# Patient Record
Sex: Female | Born: 1985 | Race: White | Hispanic: No | Marital: Married | Smoking: Never smoker
Health system: Southern US, Community
[De-identification: ages and names within clinical notes are randomized; demographics above are authoritative.]

## PROBLEM LIST (undated history)

## (undated) DIAGNOSIS — R519 Headache, unspecified: Secondary | ICD-10-CM

## (undated) DIAGNOSIS — F329 Major depressive disorder, single episode, unspecified: Secondary | ICD-10-CM

## (undated) DIAGNOSIS — T783XXA Angioneurotic edema, initial encounter: Secondary | ICD-10-CM

## (undated) DIAGNOSIS — F32A Depression, unspecified: Secondary | ICD-10-CM

## (undated) DIAGNOSIS — J45909 Unspecified asthma, uncomplicated: Secondary | ICD-10-CM

## (undated) DIAGNOSIS — F419 Anxiety disorder, unspecified: Secondary | ICD-10-CM

## (undated) DIAGNOSIS — L509 Urticaria, unspecified: Secondary | ICD-10-CM

## (undated) DIAGNOSIS — Z9889 Other specified postprocedural states: Secondary | ICD-10-CM

## (undated) DIAGNOSIS — R51 Headache: Secondary | ICD-10-CM

## (undated) DIAGNOSIS — M797 Fibromyalgia: Secondary | ICD-10-CM

## (undated) DIAGNOSIS — L309 Dermatitis, unspecified: Secondary | ICD-10-CM

## (undated) DIAGNOSIS — R112 Nausea with vomiting, unspecified: Secondary | ICD-10-CM

## (undated) DIAGNOSIS — E039 Hypothyroidism, unspecified: Secondary | ICD-10-CM

## (undated) HISTORY — PX: WISDOM TOOTH EXTRACTION: SHX21

## (undated) HISTORY — DX: Urticaria, unspecified: L50.9

## (undated) HISTORY — DX: Angioneurotic edema, initial encounter: T78.3XXA

## (undated) HISTORY — PX: NASAL SINUS SURGERY: SHX719

## (undated) HISTORY — DX: Dermatitis, unspecified: L30.9

## (undated) HISTORY — PX: KNEE ARTHROSCOPY: SUR90

## (undated) HISTORY — PX: ENDOMETRIAL BIOPSY: SHX622

---

## 2017-07-25 ENCOUNTER — Emergency Department: Payer: Managed Care, Other (non HMO)

## 2017-07-25 ENCOUNTER — Emergency Department
Admission: EM | Admit: 2017-07-25 | Discharge: 2017-07-26 | Disposition: A | Discharge status: Home / Self Care | Payer: Managed Care, Other (non HMO) | Attending: Emergency Medicine | Admitting: Emergency Medicine

## 2017-07-25 ENCOUNTER — Other Ambulatory Visit: Payer: Self-pay

## 2017-07-25 ENCOUNTER — Encounter: Payer: Self-pay | Admitting: Radiology

## 2017-07-25 DIAGNOSIS — R102 Pelvic and perineal pain: Secondary | ICD-10-CM | POA: Insufficient documentation

## 2017-07-25 DIAGNOSIS — R1031 Right lower quadrant pain: Secondary | ICD-10-CM | POA: Diagnosis present

## 2017-07-25 LAB — COMPREHENSIVE METABOLIC PANEL
ALBUMIN: 4.1 g/dL (ref 3.5–5.0)
ALT: 20 U/L (ref 0–44)
AST: 20 U/L (ref 15–41)
Alkaline Phosphatase: 63 U/L (ref 38–126)
Anion gap: 7 (ref 5–15)
BILIRUBIN TOTAL: 0.7 mg/dL (ref 0.3–1.2)
BUN: 12 mg/dL (ref 6–20)
CO2: 23 mmol/L (ref 22–32)
Calcium: 8.8 mg/dL — ABNORMAL LOW (ref 8.9–10.3)
Chloride: 108 mmol/L (ref 98–111)
Creatinine, Ser: 0.8 mg/dL (ref 0.44–1.00)
GFR calc Af Amer: >60 mL/min (ref >60)
GFR calc non Af Amer: >60 mL/min (ref >60)
GLUCOSE: 101 mg/dL — AB (ref 70–99)
Potassium: 3.6 mmol/L (ref 3.5–5.1)
Sodium: 138 mmol/L (ref 135–145)
TOTAL PROTEIN: 7.5 g/dL (ref 6.5–8.1)

## 2017-07-25 LAB — URINALYSIS, COMPLETE (UACMP) WITH MICROSCOPIC
BILIRUBIN URINE: NEGATIVE
Glucose, UA: NEGATIVE mg/dL
Hgb urine dipstick: NEGATIVE
Ketones, ur: NEGATIVE mg/dL
LEUKOCYTES UA: NEGATIVE
Nitrite: NEGATIVE
PROTEIN: NEGATIVE mg/dL
Specific Gravity, Urine: 1.01 (ref 1.005–1.030)
pH: 6 (ref 5.0–8.0)

## 2017-07-25 LAB — CBC
HEMATOCRIT: 40.7 % (ref 35.0–47.0)
HEMOGLOBIN: 14.4 g/dL (ref 12.0–16.0)
MCH: 31.1 pg (ref 26.0–34.0)
MCHC: 35.3 g/dL (ref 32.0–36.0)
MCV: 88.1 fL (ref 80.0–100.0)
Platelets: 302 10*3/uL (ref 150–440)
RBC: 4.62 MIL/uL (ref 3.80–5.20)
RDW: 13.1 % (ref 11.5–14.5)
WBC: 8.7 10*3/uL (ref 3.6–11.0)

## 2017-07-25 LAB — POCT PREGNANCY, URINE: Preg Test, Ur: NEGATIVE

## 2017-07-25 LAB — WET PREP, GENITAL
Clue Cells Wet Prep HPF POC: NONE SEEN
Sperm: NONE SEEN
TRICH WET PREP: NONE SEEN
YEAST WET PREP: NONE SEEN

## 2017-07-25 LAB — PREGNANCY, URINE: PREG TEST UR: NEGATIVE

## 2017-07-25 LAB — LIPASE, BLOOD: Lipase: 36 U/L (ref 11–51)

## 2017-07-25 MED ORDER — ONDANSETRON HCL 4 MG/2ML IJ SOLN
4.0000 mg | Freq: Once | INTRAMUSCULAR | Status: AC
  Administered 2017-07-25: 4 mg via INTRAVENOUS
  Filled 2017-07-25: qty 2

## 2017-07-25 MED ORDER — SODIUM CHLORIDE 0.9 % IV SOLN
Freq: Once | INTRAVENOUS | Status: AC
  Administered 2017-07-25: 22:00:00 via INTRAVENOUS

## 2017-07-25 MED ORDER — IOHEXOL 300 MG/ML  SOLN
100.0000 mL | Freq: Once | INTRAMUSCULAR | Status: AC | PRN
  Administered 2017-07-25: 100 mL via INTRAVENOUS

## 2017-07-25 MED ORDER — MORPHINE SULFATE (PF) 4 MG/ML IV SOLN
4.0000 mg | Freq: Once | INTRAVENOUS | Status: AC
  Administered 2017-07-25: 4 mg via INTRAVENOUS
  Filled 2017-07-25: qty 1

## 2017-07-25 NOTE — ED Provider Notes (Signed)
[facility] Emergency Department Provider Note       Time seen: ----------------------------------------- 9:30 PM on 07/25/2017 -----------------------------------------   I have reviewed the triage vital signs and the nursing notes.  HISTORY   Chief Complaint Abdominal Pain    HPI Beth Edwards is a 32 y.o. female with no significant past medical history who presents to the ED for right lower quadrant abdominal pain since 4:30 PM.  Patient reports some nausea.  Patient states this is different pain than when she had an ovarian cyst last year.  She does not think she could be pregnant because she has an IUD.  She denies fevers, chills or other complaints.  No past medical history on file.  There are no active problems to display for this patient.   Allergies Ambien [zolpidem tartrate]  Social History Social History   Tobacco Use  . Smoking status: Not on file  Substance Use Topics  . Alcohol use: Not on file  . Drug use: Not on file   Review of Systems Constitutional: Negative for fever. Cardiovascular: Negative for chest pain. Respiratory: Negative for shortness of breath. Gastrointestinal: Positive for abdominal pain, nausea Musculoskeletal: Negative for back pain. Skin: Negative for rash. Neurological: Negative for headaches, focal weakness or numbness.  All systems negative/normal/unremarkable except as stated in the HPI  ____________________________________________   PHYSICAL EXAM:  VITAL SIGNS: ED Triage Vitals  Enc Vitals Group     BP 07/25/17 1928 137/70     Pulse Rate 07/25/17 1928 79     Resp 07/25/17 1928 18     Temp 07/25/17 1928 98.7 F (37.1 C)     Temp Source 07/25/17 1928 Oral     SpO2 07/25/17 1928 100 %     Weight 07/25/17 1929 180 lb (81.6 kg)     Height 07/25/17 1929 5\' 7"  (1.702 m)     Head Circumference --      Peak Flow --      Pain Score 07/25/17 1929 9     Pain Loc --      Pain Edu? --    Excl. in GC? --    Constitutional: Alert and oriented. Well appearing and in no distress. Eyes: Conjunctivae are normal. Normal extraocular movements. ENT   Head: Normocephalic and atraumatic.   Nose: No congestion/rhinnorhea.   Mouth/Throat: Mucous membranes are moist.   Neck: No stridor. Cardiovascular: Normal rate, regular rhythm. No murmurs, rubs, or gallops. Respiratory: Normal respiratory effort without tachypnea nor retractions. Breath sounds are clear and equal bilaterally. No wheezes/rales/rhonchi. Gastrointestinal: Specific pain at McBurney's point, no rebound or guarding.  Normal bowel sounds. Genitourinary: Light vaginal discharge, no significant adnexal tenderness, no cervical motion tenderness Musculoskeletal: Nontender with normal range of motion in extremities. No lower extremity tenderness nor edema. Neurologic:  Normal speech and language. No gross focal neurologic deficits are appreciated.  Skin:  Skin is warm, dry and intact. No rash noted. Psychiatric: Mood and affect are normal. Speech and behavior are normal.  ____________________________________________  ED COURSE:  As part of my medical decision making, I reviewed the following data within the electronic MEDICAL RECORD NUMBER History obtained from family if available, nursing notes, old chart and ekg, as well as notes from prior ED visits. Patient presented for right lower quadrant abdominal pain, we will assess with labs and imaging as indicated at this time. Clinical Course as of Jul 25 2248  Fri Jul 25, 2017  2238 Patient has essentially had chronic pelvic  pain since having an ovarian cyst last year, reports this pain is worse and it is more specifically right-sided than prior.   [JW]    Clinical Course User Index [JW] Emily FilbertWilliams, Jonathan E, MD   Procedures ____________________________________________   LABS (pertinent positives/negatives)  Labs Reviewed  COMPREHENSIVE METABOLIC PANEL - Abnormal;  Notable for the following components:      Result Value   Glucose, Bld 101 (*)    Calcium 8.8 (*)    All other components within normal limits  URINALYSIS, COMPLETE (UACMP) WITH MICROSCOPIC - Abnormal; Notable for the following components:   Color, Urine YELLOW (*)    APPearance CLEAR (*)    Bacteria, UA RARE (*)    All other components within normal limits  CHLAMYDIA/NGC RT PCR ([facility] ONLY)  WET PREP, GENITAL  LIPASE, BLOOD  CBC  PREGNANCY, URINE  POCT PREGNANCY, URINE    RADIOLOGY  CT the abdomen pelvis with contrast IMPRESSION: No acute process demonstrated in the abdomen or pelvis. No evidence of bowel obstruction or inflammation. Appendix is normal. Pelvic ultrasound Is pending ____________________________________________  DIFFERENTIAL DIAGNOSIS   Appendicitis, renal colic, ovarian cyst, constipation, pregnancy  FINAL ASSESSMENT AND PLAN  Right lower quadrant abdominal pain   Plan: The patient had presented for abdominal pain. Patient's labs were unremarkable. Patient's imaging was negative regarding CT the abdomen pelvis.  Ultrasound is still pending at this time.   Ulice DashJohnathan E Williams, MD   Note: This note was generated in part or whole with voice recognition software. Voice recognition is usually quite accurate but there are transcription errors that can and very often do occur. I apologize for any typographical errors that were not detected and corrected.     Emily FilbertWilliams, Jonathan E, MD 07/25/17 2251

## 2017-07-25 NOTE — ED Notes (Signed)
Pt in CT at this time.

## 2017-07-25 NOTE — ED Triage Notes (Signed)
Reports right lower abdominal pain since 4:30 pm.  Reports some nausea.

## 2017-07-26 LAB — CHLAMYDIA/NGC RT PCR (ARMC ONLY)
Chlamydia Tr: NOT DETECTED
N gonorrhoeae: NOT DETECTED

## 2017-07-26 MED ORDER — HALOPERIDOL LACTATE 5 MG/ML IJ SOLN
2.5000 mg | Freq: Once | INTRAMUSCULAR | Status: AC
  Administered 2017-07-26: 2.5 mg via INTRAVENOUS
  Filled 2017-07-26: qty 1

## 2017-07-26 MED ORDER — KETOROLAC TROMETHAMINE 30 MG/ML IJ SOLN
15.0000 mg | Freq: Once | INTRAMUSCULAR | Status: AC
  Administered 2017-07-26: 15 mg via INTRAVENOUS
  Filled 2017-07-26: qty 1

## 2017-07-26 MED ORDER — MELOXICAM 15 MG PO TABS: 15.0000 mg | ORAL_TABLET | Freq: Every day | ORAL | 0 refills | Status: AC

## 2017-07-26 MED ORDER — DICYCLOMINE HCL 20 MG PO TABS: 20.0000 mg | ORAL_TABLET | Freq: Three times a day (TID) | ORAL | 0 refills | Status: DC | PRN

## 2017-07-26 NOTE — ED Provider Notes (Signed)
Care signed over from Dr. Mayford KnifeWilliams.  Pelvic ultrasound reassuring.  The patient persists with discomfort.  Had a lengthy discussion with the patient regarding the diagnostic uncertainty but I [facility].  Given Toradol and haloperidol here with improvement in her symptoms.  And she is medically stable for outpatient management.  She verbalizes understanding and agreement the plan.   Beth Edwards, Beth Morandi, MD 07/26/17 [phone]

## 2017-07-26 NOTE — Discharge Instructions (Signed)
Fortunately today your blood work, your CT scan, and your ultrasound were reassuring.  Please make sure you make an appointment with the [facility] gynecologist for reevaluation and return to the emergency department for any concerns such as fevers, chills, worsening pain, if you cannot eat or drink, or for any other issues whatsoever.  It was a pleasure to take care of you today, and thank you for coming to our emergency department.  If you have any questions or concerns before leaving please ask the nurse to grab me and I'm more than happy to go through your aftercare instructions again.  If you were prescribed any opioid pain medication today such as Norco, Vicodin, Percocet, morphine, hydrocodone, or oxycodone please make sure you do not drive when you are taking this medication as it can alter your ability to drive safely.  If you have any concerns once you are home that you are not improving or are in fact getting worse before you can make it to your follow-up appointment, please do not hesitate to call 911 and come back for further evaluation.  Merrily Brittle, MD  Results for orders placed or performed during the hospital encounter of 07/25/17  Chlamydia/NGC rt PCR ([facility] only)  Result Value Ref Range   Specimen source GC/Chlam ENDOCERVICAL    Chlamydia Tr NOT DETECTED NOT DETECTED   N gonorrhoeae NOT DETECTED NOT DETECTED  Wet prep, genital  Result Value Ref Range   Yeast Wet Prep HPF POC NONE SEEN NONE SEEN   Trich, Wet Prep NONE SEEN NONE SEEN   Clue Cells Wet Prep HPF POC NONE SEEN NONE SEEN   WBC, Wet Prep HPF POC FEW (A) NONE SEEN   Sperm NONE SEEN   Lipase, blood  Result Value Ref Range   Lipase 36 11 - 51 U/L  Comprehensive metabolic panel  Result Value Ref Range   Sodium 138 135 - 145 mmol/L   Potassium 3.6 3.5 - 5.1 mmol/L   Chloride 108 98 - 111 mmol/L   CO2 23 22 - 32 mmol/L   Glucose, Bld 101 (H) 70 - 99 mg/dL   BUN 12 6 - 20 mg/dL   Creatinine, Ser 4.09 0.44 - 1.00 mg/dL    Calcium 8.8 (L) 8.9 - 10.3 mg/dL   Total Protein 7.5 6.5 - 8.1 g/dL   Albumin 4.1 3.5 - 5.0 g/dL   AST 20 15 - 41 U/L   ALT 20 0 - 44 U/L   Alkaline Phosphatase 63 38 - 126 U/L   Total Bilirubin 0.7 0.3 - 1.2 mg/dL   GFR calc non Af Amer >60 >60 mL/min   GFR calc Af Amer >60 >60 mL/min   Anion gap 7 5 - 15  CBC  Result Value Ref Range   WBC 8.7 3.6 - 11.0 K/uL   RBC 4.62 3.80 - 5.20 MIL/uL   Hemoglobin 14.4 12.0 - 16.0 g/dL   HCT 81.1 91.4 - 78.2 %   MCV 88.1 80.0 - 100.0 fL   MCH 31.1 26.0 - 34.0 pg   MCHC 35.3 32.0 - 36.0 g/dL   RDW 95.6 21.3 - 08.6 %   Platelets 302 150 - 440 K/uL  Urinalysis, Complete w Microscopic  Result Value Ref Range   Color, Urine YELLOW (A) YELLOW   APPearance CLEAR (A) CLEAR   Specific Gravity, Urine 1.010 1.005 - 1.030   pH 6.0 5.0 - 8.0   Glucose, UA NEGATIVE NEGATIVE mg/dL   Hgb urine dipstick NEGATIVE NEGATIVE  Bilirubin Urine NEGATIVE NEGATIVE   Ketones, ur NEGATIVE NEGATIVE mg/dL   Protein, ur NEGATIVE NEGATIVE mg/dL   Nitrite NEGATIVE NEGATIVE   Leukocytes, UA NEGATIVE NEGATIVE   WBC, UA 0-5 0 - 5 WBC/hpf   Bacteria, UA RARE (A) NONE SEEN   Squamous Epithelial / LPF 0-5 0 - 5  Pregnancy, urine  Result Value Ref Range   Preg Test, Ur NEGATIVE NEGATIVE  Pregnancy, urine POC  Result Value Ref Range   Preg Test, Ur NEGATIVE NEGATIVE   US Pelvis Transvanginal Non-ob (tv Only)  Result Date: 07/26/2017 CLINICAL DATA:  32 y/o  F; pelvic pain since 4:30 p.m. EXAM: TRANSABDOMINAL AND TRANSVAGINAL ULTRASOUND OF PELVIS DOPPLER ULTRASOUND OF OVARIES TECHNIQUE: Both transabdominal and transvaginal ultrasound examinations of the pelvis were performed. Transabdominal technique was performed for global imaging of the pelvis including uterus, ovaries, adnexal regions, and pelvic cul-de-sac. It was necessary to proceed with endovaginal exam following the transabdominal exam to visualize the uterus and adnexa. Color and duplex Doppler ultrasound was  utilized to evaluate blood flow to the ovaries. COMPARISON:  07/25/2017 CT abdomen and pelvis FINDINGS: Uterus Measurements: 7.6 x 2.9 x 4.6 cm. No fibroids or other mass visualized. Endometrium Thickness: 4 mm. No focal abnormality visualized. IUD well seated within the uterine fundus. Right ovary Measurements: 3.1 x 2.1 x 2.0 cm. Normal appearance/no adnexal mass. Left ovary Measurements: 2.2 x 1.5 x 1.9 cm. Normal appearance/no adnexal mass. Pulsed Doppler evaluation of both ovaries demonstrates normal low-resistance arterial and venous waveforms. Other findings No abnormal free fluid. IMPRESSION: Normal pelvic ultrasound. IUD well seated within the uterine fundus. Electronically Signed   By: Mitzi Hansen M.D.   On: 07/26/2017 00:02   US Pelvis Complete  Result Date: 07/26/2017 CLINICAL DATA:  32 y/o  F; pelvic pain since 4:30 p.m. EXAM: TRANSABDOMINAL AND TRANSVAGINAL ULTRASOUND OF PELVIS DOPPLER ULTRASOUND OF OVARIES TECHNIQUE: Both transabdominal and transvaginal ultrasound examinations of the pelvis were performed. Transabdominal technique was performed for global imaging of the pelvis including uterus, ovaries, adnexal regions, and pelvic cul-de-sac. It was necessary to proceed with endovaginal exam following the transabdominal exam to visualize the uterus and adnexa. Color and duplex Doppler ultrasound was utilized to evaluate blood flow to the ovaries. COMPARISON:  07/25/2017 CT abdomen and pelvis FINDINGS: Uterus Measurements: 7.6 x 2.9 x 4.6 cm. No fibroids or other mass visualized. Endometrium Thickness: 4 mm. No focal abnormality visualized. IUD well seated within the uterine fundus. Right ovary Measurements: 3.1 x 2.1 x 2.0 cm. Normal appearance/no adnexal mass. Left ovary Measurements: 2.2 x 1.5 x 1.9 cm. Normal appearance/no adnexal mass. Pulsed Doppler evaluation of both ovaries demonstrates normal low-resistance arterial and venous waveforms. Other findings No abnormal free fluid.  IMPRESSION: Normal pelvic ultrasound. IUD well seated within the uterine fundus. Electronically Signed   By: Mitzi Hansen M.D.   On: 07/26/2017 00:02   Ct Abdomen Pelvis W Contrast  Result Date: 07/25/2017 CLINICAL DATA:  Right lower abdominal pain and nausea since 4:30 p.m. EXAM: CT ABDOMEN AND PELVIS WITH CONTRAST TECHNIQUE: Multidetector CT imaging of the abdomen and pelvis was performed using the standard protocol following bolus administration of intravenous contrast. CONTRAST:  OMNIPAQUE IOHEXOL 300 MG/ML  SOLN COMPARISON:  None. FINDINGS: Lower chest: The lung bases are clear. Hepatobiliary: No focal liver abnormality is seen. No gallstones, gallbladder wall thickening, or biliary dilatation. Pancreas: Unremarkable. No pancreatic ductal dilatation or surrounding inflammatory changes. Spleen: Normal in size without focal abnormality. Adrenals/Urinary Tract: Adrenal  glands are unremarkable. Kidneys are normal, without renal calculi, focal lesion, or hydronephrosis. Bladder is unremarkable. Stomach/Bowel: Stomach, small bowel, and colon are not abnormally distended. No wall thickening or inflammatory changes are appreciated. Stool throughout the colon. Appendix is normal. Vascular/Lymphatic: No significant vascular findings are present. No enlarged abdominal or pelvic lymph nodes. Reproductive: Uterus and ovaries are not enlarged. An intrauterine device is in place. Position appears appropriate. Other: No free air or free fluid in the abdomen. Abdominal wall musculature appears intact. Musculoskeletal: No acute or significant osseous findings. IMPRESSION: No acute process demonstrated in the abdomen or pelvis. No evidence of bowel obstruction or inflammation. Appendix is normal. Electronically Signed   By: Burman NievesWilliam  Stevens M.D.   On: 07/25/2017 22:20   [place] Pelvic Doppler (torsion R/o Or Mass Arterial Flow)  Result Date: 07/26/2017 CLINICAL DATA:  32 y/o  F; pelvic pain since 4:30 p.m.  EXAM: TRANSABDOMINAL AND TRANSVAGINAL ULTRASOUND OF PELVIS DOPPLER ULTRASOUND OF OVARIES TECHNIQUE: Both transabdominal and transvaginal ultrasound examinations of the pelvis were performed. Transabdominal technique was performed for global imaging of the pelvis including uterus, ovaries, adnexal regions, and pelvic cul-de-sac. It was necessary to proceed with endovaginal exam following the transabdominal exam to visualize the uterus and adnexa. Color and duplex Doppler ultrasound was utilized to evaluate blood flow to the ovaries. COMPARISON:  07/25/2017 CT abdomen and pelvis FINDINGS: Uterus Measurements: 7.6 x 2.9 x 4.6 cm. No fibroids or other mass visualized. Endometrium Thickness: 4 mm. No focal abnormality visualized. IUD well seated within the uterine fundus. Right ovary Measurements: 3.1 x 2.1 x 2.0 cm. Normal appearance/no adnexal mass. Left ovary Measurements: 2.2 x 1.5 x 1.9 cm. Normal appearance/no adnexal mass. Pulsed Doppler evaluation of both ovaries demonstrates normal low-resistance arterial and venous waveforms. Other findings No abnormal free fluid. IMPRESSION: Normal pelvic ultrasound. IUD well seated within the uterine fundus. Electronically Signed   By: Mitzi HansenLance  Furusawa-Stratton M.D.   On: 07/26/2017 00:02

## 2017-09-05 ENCOUNTER — Other Ambulatory Visit: Payer: Managed Care, Other (non HMO)

## 2017-09-09 ENCOUNTER — Other Ambulatory Visit: Payer: Self-pay

## 2017-09-09 ENCOUNTER — Encounter
Admission: RE | Admit: 2017-09-09 | Discharge: 2017-09-09 | Disposition: A | Discharge status: Home / Self Care | Payer: 59 | Source: Ambulatory Visit | Attending: Obstetrics & Gynecology | Admitting: Obstetrics & Gynecology

## 2017-09-09 DIAGNOSIS — G8929 Other chronic pain: Secondary | ICD-10-CM | POA: Diagnosis not present

## 2017-09-09 DIAGNOSIS — N946 Dysmenorrhea, unspecified: Secondary | ICD-10-CM | POA: Diagnosis present

## 2017-09-09 DIAGNOSIS — N8311 Corpus luteum cyst of right ovary: Secondary | ICD-10-CM | POA: Diagnosis not present

## 2017-09-09 DIAGNOSIS — Q438 Other specified congenital malformations of intestine: Secondary | ICD-10-CM | POA: Diagnosis not present

## 2017-09-09 DIAGNOSIS — K668 Other specified disorders of peritoneum: Secondary | ICD-10-CM | POA: Diagnosis not present

## 2017-09-09 DIAGNOSIS — N941 Unspecified dyspareunia: Secondary | ICD-10-CM | POA: Diagnosis present

## 2017-09-09 HISTORY — DX: Major depressive disorder, single episode, unspecified: F32.9

## 2017-09-09 HISTORY — DX: Anxiety disorder, unspecified: F41.9

## 2017-09-09 HISTORY — DX: Headache: R51

## 2017-09-09 HISTORY — DX: Other specified postprocedural states: Z98.890

## 2017-09-09 HISTORY — DX: Unspecified asthma, uncomplicated: J45.909

## 2017-09-09 HISTORY — DX: Headache, unspecified: R51.9

## 2017-09-09 HISTORY — DX: Hypothyroidism, unspecified: E03.9

## 2017-09-09 HISTORY — DX: Fibromyalgia: M79.7

## 2017-09-09 HISTORY — DX: Nausea with vomiting, unspecified: R11.2

## 2017-09-09 HISTORY — DX: Depression, unspecified: F32.A

## 2017-09-09 LAB — TYPE AND SCREEN
ABO/RH(D): A POS
ANTIBODY SCREEN: NEGATIVE

## 2017-09-09 NOTE — Patient Instructions (Signed)
Your procedure is scheduled on: Friday 09/12/17 Report to DAY SURGERY DEPARTMENT LOCATED ON 2ND FLOOR MEDICAL MALL ENTRANCE. To find out your arrival time please call [phone] between 1PM - 3PM on Thursday 09/11/17.  Remember: Instructions that are not followed completely may result in serious medical risk, up to and including death, or upon the discretion of your surgeon and anesthesiologist your surgery may need to be rescheduled.     _X__ 1. Do not eat food after midnight the night before your procedure.                 No gum chewing or hard candies. You may drink clear liquids up to 2 hours                 before you are scheduled to arrive for your surgery- DO not drink clear                 liquids within 2 hours of the start of your surgery.                 Clear Liquids include:  water, apple juice without pulp, clear carbohydrate                 drink such as Clearfast or Gatorade, Black Coffee or Tea (Do not add                 anything to coffee or tea).  __X__2.  On the morning of surgery brush your teeth with toothpaste and water, you                 may rinse your mouth with mouthwash if you wish.  Do not swallow any              toothpaste of mouthwash.     _X__ 3.  No Alcohol for 24 hours before or after surgery.   _X__ 4.  Do Not Smoke or use e-cigarettes For 24 Hours Prior to Your Surgery.                 Do not use any chewable tobacco products for at least 6 hours prior to                 surgery.  ____  5.  Bring all medications with you on the day of surgery if instructed.   __X__  6.  Notify your doctor if there is any change in your medical condition      (cold, fever, infections).     Do not wear jewelry, make-up, hairpins, clips or nail polish. Do not wear lotions, powders, or perfumes.  Do not shave 48 hours prior to surgery. Men may shave face and neck. Do not bring valuables to the hospital.    [facility] is not responsible for any belongings or  valuables.  Contacts, dentures/partials or body piercings may not be worn into surgery. Bring a case for your contacts, glasses or hearing aids, a denture cup will be supplied. Leave your suitcase in the car. After surgery it may be brought to your room. For patients admitted to the hospital, discharge time is determined by your treatment team.   Patients discharged the day of surgery will not be allowed to drive home.   Please read over the following fact sheets that you were given:   MRSA Information  __X__ Take these medicines the morning of surgery with A SIP OF WATER:  1. DULoxetine (CYMBALTA  2. levothyroxine (SYNTHROID  3.   4.  5. LORazepam (ATIVAN if needed  6.  ____ Fleet Enema (as directed)   __X__ Use CHG Soap/SAGE wipes as directed  __X__ Use inhalers on the day of surgery  ____ Stop metformin/Janumet/Farxiga 2 days prior to surgery    ____ Take 1/2 of usual insulin dose the night before surgery. No insulin the morning          of surgery.   ____ Stop Blood Thinners Coumadin/Plavix/Xarelto/Pleta/Pradaxa/Eliquis/Effient/Aspirin  on   Or contact your Surgeon, Cardiologist or Medical Doctor regarding  ability to stop your blood thinners  __X__ Stop Anti-inflammatories 7 days before surgery such as Advil, Ibuprofen, Motrin,  BC or Goodies Powder, Naprosyn, Naproxen, Aleve, Aspirin TYLENOL OK   __X__ Stop all herbal supplements, fish oil or vitamin E until after surgery.  HOLD MELATONIN UNTIL AFTER SURGERY  ____ Bring C-Pap to the hospital.

## 2017-09-12 ENCOUNTER — Ambulatory Visit: Payer: 59 | Admitting: Anesthesiology

## 2017-09-12 ENCOUNTER — Encounter
Admission: RE | Disposition: A | Discharge status: Home / Self Care | Payer: Self-pay | Source: Ambulatory Visit | Attending: Obstetrics & Gynecology

## 2017-09-12 ENCOUNTER — Ambulatory Visit
Admission: RE | Admit: 2017-09-12 | Discharge: 2017-09-12 | Disposition: A | Discharge status: Home / Self Care | Payer: 59 | Source: Ambulatory Visit | Attending: Obstetrics & Gynecology | Admitting: Obstetrics & Gynecology

## 2017-09-12 ENCOUNTER — Encounter: Payer: Self-pay | Admitting: *Deleted

## 2017-09-12 DIAGNOSIS — G8929 Other chronic pain: Secondary | ICD-10-CM | POA: Diagnosis not present

## 2017-09-12 DIAGNOSIS — R102 Pelvic and perineal pain: Secondary | ICD-10-CM | POA: Diagnosis present

## 2017-09-12 DIAGNOSIS — Q438 Other specified congenital malformations of intestine: Secondary | ICD-10-CM | POA: Insufficient documentation

## 2017-09-12 DIAGNOSIS — N941 Unspecified dyspareunia: Secondary | ICD-10-CM | POA: Insufficient documentation

## 2017-09-12 DIAGNOSIS — N8311 Corpus luteum cyst of right ovary: Secondary | ICD-10-CM | POA: Insufficient documentation

## 2017-09-12 DIAGNOSIS — K668 Other specified disorders of peritoneum: Secondary | ICD-10-CM | POA: Insufficient documentation

## 2017-09-12 DIAGNOSIS — N946 Dysmenorrhea, unspecified: Secondary | ICD-10-CM | POA: Diagnosis present

## 2017-09-12 HISTORY — PX: LAPAROSCOPY: SHX197

## 2017-09-12 HISTORY — PX: CHROMOPERTUBATION: SHX6288

## 2017-09-12 LAB — ABO/RH: ABO/RH(D): A POS

## 2017-09-12 LAB — POCT PREGNANCY, URINE: Preg Test, Ur: NEGATIVE

## 2017-09-12 SURGERY — LAPAROSCOPY OPERATIVE
Anesthesia: General | Wound class: Clean Contaminated

## 2017-09-12 MED ORDER — KETAMINE HCL 10 MG/ML IJ SOLN
INTRAMUSCULAR | Status: DC | PRN
  Administered 2017-09-12 (×2): 25 mg via INTRAVENOUS

## 2017-09-12 MED ORDER — FENTANYL CITRATE (PF) 250 MCG/5ML IJ SOLN
INTRAMUSCULAR | Status: AC
  Filled 2017-09-12: qty 5

## 2017-09-12 MED ORDER — LIDOCAINE HCL (PF) 2 % IJ SOLN
INTRAMUSCULAR | Status: AC
  Filled 2017-09-12: qty 10

## 2017-09-12 MED ORDER — STERILE WATER FOR IRRIGATION IR SOLN
Status: DC | PRN
  Administered 2017-09-12: 8 mL

## 2017-09-12 MED ORDER — PHENYLEPHRINE HCL 10 MG/ML IJ SOLN
INTRAMUSCULAR | Status: DC | PRN
  Administered 2017-09-12 (×3): 100 ug via INTRAVENOUS
  Administered 2017-09-12: 200 ug via INTRAVENOUS
  Administered 2017-09-12 (×3): 100 ug via INTRAVENOUS

## 2017-09-12 MED ORDER — CELECOXIB 200 MG PO CAPS
ORAL_CAPSULE | ORAL | Status: AC
  Administered 2017-09-12: 400 mg via ORAL
  Filled 2017-09-12: qty 2

## 2017-09-12 MED ORDER — FENTANYL CITRATE (PF) 100 MCG/2ML IJ SOLN
INTRAMUSCULAR | Status: DC | PRN
  Administered 2017-09-12 (×2): 100 ug via INTRAVENOUS
  Administered 2017-09-12: 50 ug via INTRAVENOUS

## 2017-09-12 MED ORDER — HYDROMORPHONE HCL 1 MG/ML IJ SOLN
INTRAMUSCULAR | Status: DC | PRN
  Administered 2017-09-12 (×2): 0.5 mg via INTRAVENOUS

## 2017-09-12 MED ORDER — GLYCOPYRROLATE 0.2 MG/ML IJ SOLN
INTRAMUSCULAR | Status: AC
  Filled 2017-09-12: qty 1

## 2017-09-12 MED ORDER — ONDANSETRON HCL 4 MG/2ML IJ SOLN
INTRAMUSCULAR | Status: AC
  Filled 2017-09-12: qty 2

## 2017-09-12 MED ORDER — FAMOTIDINE 20 MG PO TABS
20.0000 mg | ORAL_TABLET | Freq: Once | ORAL | Status: AC
  Administered 2017-09-12: 20 mg via ORAL

## 2017-09-12 MED ORDER — SUGAMMADEX SODIUM 200 MG/2ML IV SOLN
INTRAVENOUS | Status: AC
  Filled 2017-09-12: qty 2

## 2017-09-12 MED ORDER — SCOPOLAMINE 1 MG/3DAYS TD PT72
MEDICATED_PATCH | TRANSDERMAL | Status: AC
  Filled 2017-09-12: qty 1

## 2017-09-12 MED ORDER — ACETAMINOPHEN 325 MG PO TABS
ORAL_TABLET | ORAL | Status: AC
  Administered 2017-09-12: 650 mg via ORAL
  Filled 2017-09-12: qty 2

## 2017-09-12 MED ORDER — HYDROMORPHONE HCL 1 MG/ML IJ SOLN
INTRAMUSCULAR | Status: AC
  Filled 2017-09-12: qty 1

## 2017-09-12 MED ORDER — ACETAMINOPHEN 325 MG PO TABS
650.0000 mg | ORAL_TABLET | Freq: Once | ORAL | Status: AC
  Administered 2017-09-12: 650 mg via ORAL

## 2017-09-12 MED ORDER — METHYLENE BLUE 0.5 % INJ SOLN
INTRAVENOUS | Status: AC
  Filled 2017-09-12: qty 10

## 2017-09-12 MED ORDER — MIDAZOLAM HCL 2 MG/2ML IJ SOLN
INTRAMUSCULAR | Status: DC | PRN
  Administered 2017-09-12: 3 mg via INTRAVENOUS
  Administered 2017-09-12: 2 mg via INTRAVENOUS

## 2017-09-12 MED ORDER — SCOPOLAMINE 1 MG/3DAYS TD PT72
1.0000 | MEDICATED_PATCH | TRANSDERMAL | Status: DC
  Administered 2017-09-12: 1.5 mg via TRANSDERMAL

## 2017-09-12 MED ORDER — PROPOFOL 10 MG/ML IV BOLUS
INTRAVENOUS | Status: DC | PRN
  Administered 2017-09-12: 150 mg via INTRAVENOUS

## 2017-09-12 MED ORDER — LACTATED RINGERS IV SOLN
INTRAVENOUS | Status: DC
  Administered 2017-09-12 (×2): via INTRAVENOUS

## 2017-09-12 MED ORDER — FAMOTIDINE 20 MG PO TABS
ORAL_TABLET | ORAL | Status: AC
  Administered 2017-09-12: 20 mg via ORAL
  Filled 2017-09-12: qty 1

## 2017-09-12 MED ORDER — IBUPROFEN 800 MG PO TABS: 800.0000 mg | ORAL_TABLET | Freq: Four times a day (QID) | ORAL | 0 refills | Status: DC

## 2017-09-12 MED ORDER — SUGAMMADEX SODIUM 200 MG/2ML IV SOLN
INTRAVENOUS | Status: DC | PRN
  Administered 2017-09-12: 200 mg via INTRAVENOUS

## 2017-09-12 MED ORDER — PROPOFOL 10 MG/ML IV BOLUS
INTRAVENOUS | Status: AC
  Filled 2017-09-12: qty 20

## 2017-09-12 MED ORDER — ROCURONIUM BROMIDE 100 MG/10ML IV SOLN
INTRAVENOUS | Status: AC
  Filled 2017-09-12: qty 1

## 2017-09-12 MED ORDER — PROPOFOL 500 MG/50ML IV EMUL
INTRAVENOUS | Status: AC
  Filled 2017-09-12: qty 50

## 2017-09-12 MED ORDER — OXYCODONE HCL 5 MG PO TABS: 5.0000 mg | ORAL_TABLET | ORAL | 0 refills | Status: AC | PRN

## 2017-09-12 MED ORDER — PHENYLEPHRINE HCL 10 MG/ML IJ SOLN
INTRAMUSCULAR | Status: AC
  Filled 2017-09-12: qty 1

## 2017-09-12 MED ORDER — DEXAMETHASONE SODIUM PHOSPHATE 10 MG/ML IJ SOLN
INTRAMUSCULAR | Status: AC
  Filled 2017-09-12: qty 1

## 2017-09-12 MED ORDER — ACETAMINOPHEN 650 MG RE SUPP
650.0000 mg | RECTAL | Status: DC | PRN
  Filled 2017-09-12: qty 1

## 2017-09-12 MED ORDER — ONDANSETRON HCL 4 MG/2ML IJ SOLN: 4.0000 mg | Freq: Once | INTRAMUSCULAR | Status: DC | PRN

## 2017-09-12 MED ORDER — DEXAMETHASONE SODIUM PHOSPHATE 10 MG/ML IJ SOLN
INTRAMUSCULAR | Status: DC | PRN
  Administered 2017-09-12: 10 mg via INTRAVENOUS

## 2017-09-12 MED ORDER — CELECOXIB 200 MG PO CAPS
400.0000 mg | ORAL_CAPSULE | Freq: Once | ORAL | Status: AC
  Administered 2017-09-12: 400 mg via ORAL

## 2017-09-12 MED ORDER — ACETAMINOPHEN 325 MG PO TABS: 650.0000 mg | ORAL_TABLET | ORAL | Status: DC | PRN

## 2017-09-12 MED ORDER — GABAPENTIN 300 MG PO CAPS
ORAL_CAPSULE | ORAL | Status: AC
  Administered 2017-09-12: 600 mg
  Filled 2017-09-12: qty 2

## 2017-09-12 MED ORDER — DIPHENHYDRAMINE HCL 50 MG/ML IJ SOLN
INTRAMUSCULAR | Status: DC | PRN
  Administered 2017-09-12: 25 mg via INTRAVENOUS

## 2017-09-12 MED ORDER — PROPOFOL 500 MG/50ML IV EMUL
INTRAVENOUS | Status: DC | PRN
  Administered 2017-09-12: 25 ug/kg/min via INTRAVENOUS

## 2017-09-12 MED ORDER — GLYCOPYRROLATE 0.2 MG/ML IJ SOLN
INTRAMUSCULAR | Status: DC | PRN
  Administered 2017-09-12: 0.1 mg via INTRAVENOUS

## 2017-09-12 MED ORDER — MIDAZOLAM HCL 5 MG/5ML IJ SOLN
INTRAMUSCULAR | Status: AC
  Filled 2017-09-12: qty 5

## 2017-09-12 MED ORDER — ROCURONIUM BROMIDE 100 MG/10ML IV SOLN
INTRAVENOUS | Status: DC | PRN
  Administered 2017-09-12: 50 mg via INTRAVENOUS

## 2017-09-12 MED ORDER — FENTANYL CITRATE (PF) 100 MCG/2ML IJ SOLN: 25.0000 ug | INTRAMUSCULAR | Status: DC | PRN

## 2017-09-12 MED ORDER — GABAPENTIN 600 MG PO TABS
600.0000 mg | ORAL_TABLET | Freq: Once | ORAL | Status: AC
  Filled 2017-09-12: qty 1

## 2017-09-12 MED ORDER — ONDANSETRON HCL 4 MG/2ML IJ SOLN
INTRAMUSCULAR | Status: DC | PRN
  Administered 2017-09-12: 4 mg via INTRAVENOUS

## 2017-09-12 MED ORDER — LIDOCAINE HCL (CARDIAC) PF 100 MG/5ML IV SOSY
PREFILLED_SYRINGE | INTRAVENOUS | Status: DC | PRN
  Administered 2017-09-12: 50 mg via INTRAVENOUS
  Administered 2017-09-12: 10 mg via INTRAVENOUS

## 2017-09-12 MED ORDER — LACTATED RINGERS IV SOLN: INTRAVENOUS | Status: DC

## 2017-09-12 MED ORDER — MORPHINE SULFATE (PF) 4 MG/ML IV SOLN: 1.0000 mg | INTRAVENOUS | Status: DC | PRN

## 2017-09-12 MED ORDER — KETAMINE HCL 50 MG/ML IJ SOLN
INTRAMUSCULAR | Status: AC
  Filled 2017-09-12: qty 10

## 2017-09-12 SURGICAL SUPPLY — 66 items
BACTOSHIELD CHG 4% 4OZ (MISCELLANEOUS) ×1
BAG URINE DRAINAGE (UROLOGICAL SUPPLIES) ×4 IMPLANT
BLADE SURG SZ11 CARB STEEL (BLADE) ×4 IMPLANT
CANISTER SUCT 1200ML W/VALVE (MISCELLANEOUS) ×4 IMPLANT
CATH FOLEY 2WAY  5CC 16FR (CATHETERS) ×2
CATH URTH 16FR FL 2W BLN LF (CATHETERS) ×2 IMPLANT
CHLORAPREP W/TINT 26ML (MISCELLANEOUS) ×8 IMPLANT
DEFOGGER SCOPE WARMER CLEARIFY (MISCELLANEOUS) ×4 IMPLANT
DERMABOND ADVANCED (GAUZE/BANDAGES/DRESSINGS) ×2
DERMABOND ADVANCED .7 DNX12 (GAUZE/BANDAGES/DRESSINGS) ×2 IMPLANT
DEVICE SUTURE ENDOST 10MM (ENDOMECHANICALS) IMPLANT
DRAPE LEGGINS SURG 28X43 STRL (DRAPES) ×4 IMPLANT
DRAPE SHEET LG 3/4 BI-LAMINATE (DRAPES) ×4 IMPLANT
DRAPE UNDER BUTTOCK W/FLU (DRAPES) ×4 IMPLANT
ELECT REM PT RETURN 9FT ADLT (ELECTROSURGICAL) ×4
ELECTRODE REM PT RTRN 9FT ADLT (ELECTROSURGICAL) ×2 IMPLANT
GLOVE PI ORTHOPRO 6.5 (GLOVE) ×2
GLOVE PI ORTHOPRO STRL 6.5 (GLOVE) ×2 IMPLANT
GLOVE SURG SYN 6.5 ES PF (GLOVE) ×12 IMPLANT
GOWN STRL REUS W/ TWL LRG LVL3 (GOWN DISPOSABLE) ×6 IMPLANT
GOWN STRL REUS W/ TWL XL LVL3 (GOWN DISPOSABLE) ×2 IMPLANT
GOWN STRL REUS W/TWL LRG LVL3 (GOWN DISPOSABLE) ×6
GOWN STRL REUS W/TWL XL LVL3 (GOWN DISPOSABLE) ×2
GRASPER SUT TROCAR 14GX15 (MISCELLANEOUS) IMPLANT
IRRIGATION STRYKERFLOW (MISCELLANEOUS) IMPLANT
IRRIGATOR STRYKERFLOW (MISCELLANEOUS)
IV LACTATED RINGERS 1000ML (IV SOLUTION) ×4 IMPLANT
IV NS 1000ML (IV SOLUTION)
IV NS 1000ML BAXH (IV SOLUTION) IMPLANT
KIT PINK PAD W/HEAD ARE REST (MISCELLANEOUS) ×4
KIT PINK PAD W/HEAD ARM REST (MISCELLANEOUS) ×2 IMPLANT
KIT TURNOVER CYSTO (KITS) ×4 IMPLANT
L-HOOK LAP DISP 36CM (ELECTROSURGICAL) ×4
LABEL OR SOLS (LABEL) IMPLANT
LHOOK LAP DISP 36CM (ELECTROSURGICAL) ×2 IMPLANT
LIGASURE VESSEL 5MM BLUNT TIP (ELECTROSURGICAL) IMPLANT
MANIPULATOR UTERINE 4.5 ZUMI (MISCELLANEOUS) IMPLANT
MANIPULATOR VCARE LG CRV RETR (MISCELLANEOUS) IMPLANT
MANIPULATOR VCARE SML CRV RETR (MISCELLANEOUS) IMPLANT
MANIPULATOR VCARE STD CRV RETR (MISCELLANEOUS) IMPLANT
NEEDLE HYPO 22GX1.5 SAFETY (NEEDLE) ×4 IMPLANT
NS IRRIG 500ML POUR BTL (IV SOLUTION) ×4 IMPLANT
PACK LAP CHOLECYSTECTOMY (MISCELLANEOUS) ×4 IMPLANT
PAD OB MATERNITY 4.3X12.25 (PERSONAL CARE ITEMS) ×4 IMPLANT
PAD PREP 24X41 OB/GYN DISP (PERSONAL CARE ITEMS) ×4 IMPLANT
PENCIL ELECTRO HAND CTR (MISCELLANEOUS) ×4 IMPLANT
POUCH SPECIMEN RETRIEVAL 10MM (ENDOMECHANICALS) IMPLANT
SCISSORS METZENBAUM CVD 33 (INSTRUMENTS) IMPLANT
SCRUB CHG 4% DYNA-HEX 4OZ (MISCELLANEOUS) ×3 IMPLANT
SET CYSTO W/LG BORE CLAMP LF (SET/KITS/TRAYS/PACK) IMPLANT
SET YANKAUER POOLE SUCT (MISCELLANEOUS) ×4 IMPLANT
SLEEVE ENDOPATH XCEL 5M (ENDOMECHANICALS) ×4 IMPLANT
SURGILUBE 2OZ TUBE FLIPTOP (MISCELLANEOUS) ×4 IMPLANT
SUT ENDO VLOC 180-0-8IN (SUTURE) IMPLANT
SUT MNCRL 4-0 (SUTURE)
SUT MNCRL 4-0 27XMFL (SUTURE)
SUT MNCRL AB 4-0 PS2 18 (SUTURE) ×4 IMPLANT
SUT VIC AB 0 CT1 36 (SUTURE) ×8 IMPLANT
SUT VIC AB 2-0 UR6 27 (SUTURE) IMPLANT
SUTURE MNCRL 4-0 27XMF (SUTURE) IMPLANT
SYR 10ML LL (SYRINGE) ×4 IMPLANT
TROCAR ENDO BLADELESS 11MM (ENDOMECHANICALS) IMPLANT
TROCAR XCEL NON-BLD 5MMX100MML (ENDOMECHANICALS) ×4 IMPLANT
TUBING ART PRESS 48 MALE/FEM (TUBING) ×4 IMPLANT
TUBING INSUF HEATED (TUBING) ×4 IMPLANT
TUBING INSUFFLATION (TUBING) ×4 IMPLANT

## 2017-09-12 NOTE — Anesthesia Procedure Notes (Signed)
Procedure Name: Intubation Date/Time: 09/12/2017 7:46 AM Performed by: Sherol DadeMacMang, Nazeer Romney H, CRNA Pre-anesthesia Checklist: Patient identified, Emergency Drugs available, Suction available, Patient being monitored and Timeout performed Patient Re-evaluated:Patient Re-evaluated prior to induction Oxygen Delivery Method: Circle system utilized Induction Type: IV induction and Cricoid Pressure applied Ventilation: Mask ventilation without difficulty Laryngoscope Size: Miller and 2 Grade View: Grade II Tube type: Oral Tube size: 7.5 mm Number of attempts: 1 Airway Equipment and Method: Stylet Placement Confirmation: ETT inserted through vocal cords under direct vision,  positive ETCO2,  CO2 detector and breath sounds checked- equal and bilateral Secured at: 21 cm Tube secured with: Tape Dental Injury: Teeth and Oropharynx as per pre-operative assessment

## 2017-09-12 NOTE — Discharge Instructions (Signed)
Discharge instructions:  Call office if you have any of the following: fever >101 F, chills, excessive vaginal bleeding, incision drainage or problems, leg pain or redness, or any other concerns.   Activity: Do not lift > 15 lbs for 2 weeks.  No driving until you are certain you can slam on the brakes in an emergency.  Do not drive on narcotics.  You may feel some pain in your upper right abdomen/rib and right shoulder.  This is from the gas in the abdomen for surgery. This will subside over time, please be patient!  Take 800mg  Ibuprofen and 1000mg  Tylenol around the clock, every 6 hours for at least the first 3-5 days.  After this you can take as needed.  This will help decrease inflammation and promote healing.  The narcotics you'll take just as needed, as they just trick your brain into thinking its not in pain.    Please don't limit yourself in terms of routine activity.  You will be able to do most things, although they may take longer to do or be a little painful.  You can do it!  Don't be a hero, but don't be a wimp either!    AMBULATORY SURGERY  DISCHARGE INSTRUCTIONS   1) The drugs that you were given will stay in your system until tomorrow so for the next 24 hours you should not:  A) Drive an automobile B) Make any legal decisions C) Drink any alcoholic beverage   2) You may resume regular meals tomorrow.  Today it is better to start with liquids and gradually work up to solid foods.  You may eat anything you prefer, but it is better to start with liquids, then soup and crackers, and gradually work up to solid foods.   3) Please notify your doctor immediately if you have any unusual bleeding, trouble breathing, redness and pain at the surgery site, drainage, fever, or pain not relieved by medication.    4) Additional Instructions:        Please contact your physician with any problems or Same Day Surgery at [phone], Monday through Friday 6 am to 4 pm, or [facility] Main number at [phone].

## 2017-09-12 NOTE — Anesthesia Postprocedure Evaluation (Signed)
Anesthesia Post Note  Patient: Beth Edwards  Procedure(s) Performed: LAPAROSCOPY OPERATIVE with peritoneal biopsies (N/A ) CHROMOPERTUBATION  Patient location during evaluation: PACU Anesthesia Type: General Level of consciousness: awake and alert Pain management: pain level controlled Vital Signs Assessment: post-procedure vital signs reviewed and stable Respiratory status: spontaneous breathing and respiratory function stable Cardiovascular status: stable Anesthetic complications: no     Last Vitals:  Vitals:   09/12/17 1005 09/12/17 1020  BP: 135/84 132/75  Pulse: 94 92  Resp: (!) 9 10  Temp:    SpO2: 99% 99%    Last Pain:  Vitals:   09/12/17 1020  PainSc: 0-No pain                 KEPHART,WILLIAM K

## 2017-09-12 NOTE — H&P (Signed)
Preoperative History and Physical  Beth Edwards is a 32 y.o. G0 with chronic pelvic pain No significant preoperative concerns.  Proposed surgery: diagnostic laparoscopy with peritoneal biopsies  Past Medical History:  Diagnosis Date  . Anxiety   . Asthma   . Depression   . Fibromyalgia   . Headache   . Hypothyroidism   . PONV (postoperative nausea and vomiting)    after knee surgery was "very ill'   Past Surgical History:  Procedure Laterality Date  . KNEE ARTHROSCOPY Left   . NASAL SINUS SURGERY    . WISDOM TOOTH EXTRACTION     OB History  No data available  Patient denies any other pertinent gynecologic issues.   No current facility-administered medications on file prior to encounter.    Current Outpatient Medications on File Prior to Encounter  Medication Sig Dispense Refill  . acetaminophen (TYLENOL) 500 MG tablet Take 1,000 mg by mouth 2 (two) times daily as needed for moderate pain or headache.    . albuterol (PROVENTIL HFA;VENTOLIN HFA) 108 (90 Base) MCG/ACT inhaler Inhale 2 puffs into the lungs every 6 (six) hours as needed for wheezing or shortness of breath.    . Cyanocobalamin (B-12 COMPLIANCE INJECTION) 1000 MCG/ML KIT Inject 1,000 mcg as directed every 30 (thirty) days.    . DULoxetine (CYMBALTA) 60 MG capsule Take 60 mg by mouth daily.    Marland Kitchen gabapentin (NEURONTIN) 300 MG capsule Take 600 mg by mouth 3 (three) times daily.    Marland Kitchen levocetirizine (XYZAL) 5 MG tablet Take 5 mg by mouth every evening.    Marland Kitchen levothyroxine (SYNTHROID, LEVOTHROID) 25 MCG tablet Take 25 mcg by mouth daily.  11  . LORazepam (ATIVAN) 0.5 MG tablet Take 0.5 mg by mouth 3 (three) times daily as needed for anxiety.    . Magnesium Citrate 125 MG CAPS Take 125 mg by mouth 2 (two) times a week.    . Melatonin 3 MG TABS Take 3 mg by mouth at bedtime.    . montelukast (SINGULAIR) 10 MG tablet Take 10 mg by mouth every evening.    . ondansetron (ZOFRAN-ODT) 4 MG disintegrating tablet Take 4 mg  by mouth every 8 (eight) hours as needed for nausea or vomiting.    Marland Kitchen PARAGARD INTRAUTERINE COPPER IU 1 Device by Intrauterine route once.    . rizatriptan (MAXALT) 10 MG tablet Take 10 mg by mouth as needed for migraine. May repeat in 2 hours if needed    . topiramate (TOPAMAX) 100 MG tablet Take 100 mg by mouth every evening.    . vitamin C (ASCORBIC ACID) 500 MG tablet Take 500 mg by mouth See admin instructions. Take 500 mg 5 times weekly    . Vitamin D, Ergocalciferol, (DRISDOL) 50000 units CAPS capsule Take 50,000 Units by mouth every Sunday.    . dicyclomine (BENTYL) 20 MG tablet Take 1 tablet (20 mg total) by mouth 3 (three) times daily as needed for spasms. (Patient not taking: Reported on 09/01/2017) 30 tablet 0  . meloxicam (MOBIC) 15 MG tablet Take 1 tablet (15 mg total) by mouth daily. (Patient not taking: Reported on 09/01/2017) 30 tablet 0   Allergies  Allergen Reactions  . Ambien [Zolpidem Tartrate] Other (See Comments)    hallucinations  . Toradol [Ketorolac Tromethamine] Nausea And Vomiting    Panic attacks     Social History:   reports that she has never smoked. She has never used smokeless tobacco. She reports that she drank alcohol.  She reports that she does not use drugs.  History reviewed. No pertinent family history.  Review of Systems: Noncontributory  PHYSICAL EXAM: Blood pressure 126/77, pulse (!) 102, temperature (!) 97 F (36.1 C), resp. rate 18, weight 85.7 kg, last menstrual period 08/17/2017, SpO2 99 %. General appearance - alert, well appearing, and in no distress Chest - clear to auscultation, no wheezes, rales or rhonchi, symmetric air entry Heart - normal rate and regular rhythm Abdomen - soft, nontender, nondistended, no masses or organomegaly Pelvic - examination not indicated Extremities - peripheral pulses normal, no pedal edema, no clubbing or cyanosis  Labs: Results for orders placed or performed during the hospital encounter of 09/12/17 (from  the past 336 hour(s))  Pregnancy, urine POC   Collection Time: 09/12/17  6:22 AM  Result Value Ref Range   Preg Test, Ur NEGATIVE NEGATIVE  Results for orders placed or performed during the hospital encounter of 09/09/17 (from the past 336 hour(s))  Type and screen Jenkins   Collection Time: 09/09/17  9:00 AM  Result Value Ref Range   ABO/RH(D) A POS    Antibody Screen NEG    Sample Expiration 09/23/2017    Extend sample reason      NO TRANSFUSIONS OR PREGNANCY IN THE PAST 3 MONTHS Performed at [facility], [address], [place], [place] [address]     Imaging Studies: No results found.  Assessment: Patient Active Problem List   Diagnosis Date Noted  . Pelvic pain in female 09/12/2017  . Dysmenorrhea 09/12/2017    Plan: Patient will undergo surgical management with diagnostic laparoscopy and peritoneal biopsies.   The risks of surgery were discussed in detail with the patient including but not limited to: bleeding which may require transfusion or reoperation; infection which may require antibiotics; injury to surrounding organs which may involve bowel, bladder, ureters ; need for additional procedures including laparoscopy or laparotomy; thromboembolic phenomenon, surgical site problems and other postoperative/anesthesia complications. Likelihood of success in alleviating the patient's condition was discussed. Routine postoperative instructions will be reviewed with the patient and her family in detail after surgery.  The patient concurred with the proposed plan, giving informed written consent for the surgery.  Patient has been NPO since last night she will remain NPO for procedure.  Anesthesia and OR aware.  SCDs ordered on call to the OR.  To OR when ready.  ----- Larey Days, MD Attending Obstetrician and Gynecologist [facility], Department of [facility]

## 2017-09-12 NOTE — Anesthesia Post-op Follow-up Note (Signed)
Anesthesia QCDR form completed.        

## 2017-09-12 NOTE — Op Note (Addendum)
Beth Edwards PROCEDURE DATE: 09/12/2017  PATIENT:  Beth Edwards  32 y.o. female  PRE-OPERATIVE DIAGNOSIS:  chronic pelvic pain  POST-OPERATIVE DIAGNOSIS:  chronic pelvic pain, endometriosis stage 1, redundant colon, ovarian cyst (right).  PROCEDURE:  Procedure(s): LAPAROSCOPY OPERATIVE with peritoneal biopsies (N/A) CHROMOPERTUBATION  SURGEON:  Surgeon(s) and Role:    * Maylea Soria, Elenora Fender, MD - Primary  ANESTHESIA:  General via ET  I/O  Total I/O In: 1500 [I.V.:1500] Out: 180 [Urine:150; Blood:30]  FINDINGS:   Bimanual exam: retroverted uterus, mobile.  No adnexal masses. Speculum exam:  Small cervix, no cervical or vaginal lesions, IUD strings visible. Laparoscopic: small uterus with shortened right round ligament causing deviation of uterus to the right, along sidewall.   Enlarged right ovary with apical cyst; normal appearing.  Normal appearing left ovary.   Normal appearing bilateral fallopian tubes; patent after chromopertubation. No hemochromatic endometriosis lesions noted in the pelvic peritoneum or other visceral surfaces.   Small Allen-Masters windows on right bladder peritoneum at uterovesical junction. Another window in posterior culdesac, anteriorly.   Right fold of peritoneum from apex of posterior culdesac to area of bifurcation of internal/external iliac vessels. Normal appendix. Normal upper abdomen. Colon is dilated at cecum, prominent and full at descending colon, and sigmoid is full and redundant - quite long, draping in loops in the pelvis.   Terminal ileum adherent to pelvic ridge/sidewall.    SPECIMEN:  Peritoneal biopsies - anterior and posterior Right ovarian cyst wall  COMPLICATIONS: none apparent  DISPOSITION: vital signs stable to PACU   Indication for Surgery: 32 y.o. with chronic pelvic pain, dysmenorrhea, and dyspareunia.     Risks of surgery were discussed with the patient including but not limited to: bleeding which may require  transfusion or reoperation; infection which may require antibiotics; injury to bowel, bladder, ureters or other surrounding organs; need for additional procedures including laparotomy, blood clot, incisional problems and other postoperative/anesthesia complications. Written informed consent was obtained.    PROCEDURE IN DETAIL:  The patient had sequential compression devices applied to her lower extremities while in the preoperative area.  She was then taken to the operating room where general anesthesia was administered and was found to be adequate.  She was placed in the dorsal lithotomy position, and was prepped and draped in a sterile manner.  A Foley catheter was inserted into her bladder and attached to constant drainage and a uterine manipulator was then advanced into the uterus .  After a surgical timeout was performed, attention was turned to the abdomen where an umbilical incision was made with the scalpel. A 5mm trochar was inserted in the umbilical incision using a visiport method. Opening pressure was , and the abdomen was insufflated to carbon dioxide gas and adequate pneumoperitoneum was obtained.  A survey of the patient's pelvis and abdomen revealed the findings as mentioned above. Another 5mm port was inserted in the lower left quadrant under visualization.    Monopolar scissors were used to cauterized a border surrounding the Allen-Masters window anteriorly and the peritoneum was excised and sent to pathology.  The posterior culdesac window and band of peritoneum was dissected in the same manner.  Upon manipulation of the right ovary, the apical cyst ruptured, and the contents of the cyst +wall were grasped and removed.  The sites were hemostatic.  Methylene blue was injected into the acorn manipulator and blue fluid was seen spilling from the bilateral fimbriated ends.  The operative site was surveyed, and it  was found to be hemostatic.  No intraoperative injury to  surrounding organs was noted.  The abdomen was desufflated and all instruments were then removed from the patient's abdomen. The uterine manipulator was removed without complications.  All skin incisions were closed with 4-0 monocryl and covered with surgical glue. The patient tolerated the procedures well.  All instruments, needles, and sponge counts were correct x 2. The patient was taken to the recovery room in stable condition.   ---- Ranae Plumberhelsea Ngai Parcell, MD Attending Obstetrician and Gynecologist [facility]

## 2017-09-12 NOTE — Anesthesia Preprocedure Evaluation (Signed)
Anesthesia Evaluation  Patient identified by MRN, date of birth, ID band Patient awake    Reviewed: Allergy & Precautions, NPO status , Patient's Chart, lab work & pertinent test results  History of Anesthesia Complications (+) PONV and history of anesthetic complications  Airway Mallampati: III     Mouth opening: Limited Mouth Opening  Dental   Pulmonary asthma , neg sleep apnea,           Cardiovascular (-) hypertension(-) Past MI and (-) CHF (-) dysrhythmias (-) Valvular Problems/Murmurs     Neuro/Psych neg Seizures Anxiety Depression    GI/Hepatic neg GERD  ,  Endo/Other  neg diabetesHypothyroidism   Renal/GU negative Renal ROS     Musculoskeletal   Abdominal   Peds  Hematology   Anesthesia Other Findings   Reproductive/Obstetrics                             Anesthesia Physical Anesthesia Plan  ASA: II  Anesthesia Plan: General   Post-op Pain Management:    Induction: Intravenous  PONV Risk Score and Plan: 4 or greater and Ondansetron, Dexamethasone, Midazolam and Scopolamine patch - Pre-op  Airway Management Planned: Oral ETT  Additional Equipment:   Intra-op Plan:   Post-operative Plan:   Informed Consent: I have reviewed the patients History and Physical, chart, labs and discussed the procedure including the risks, benefits and alternatives for the proposed anesthesia with the patient or authorized representative who has indicated his/her understanding and acceptance.     Plan Discussed with:   Anesthesia Plan Comments:         Anesthesia Quick Evaluation

## 2017-09-12 NOTE — Transfer of Care (Signed)
Immediate Anesthesia Transfer of Care Note  Patient: Beth Edwards  Procedure(s) Performed: LAPAROSCOPY OPERATIVE with peritoneal biopsies (N/A ) CHROMOPERTUBATION  Patient Location: PACU  Anesthesia Type:General  Level of Consciousness: drowsy and patient cooperative  Airway & Oxygen Therapy: Patient Spontanous Breathing and Patient connected to face mask oxygen  Post-op Assessment: Report given to RN, Post -op Vital signs reviewed and stable and Patient moving all extremities  Post vital signs: Reviewed and stable  Last Vitals:  Vitals Value Taken Time  BP    Temp    Pulse    Resp    SpO2      Last Pain:  Vitals:   09/12/17 0622  PainSc: 0-No pain         Complications: No apparent anesthesia complications

## 2017-09-12 NOTE — Addendum Note (Signed)
Addendum  created 09/12/17 1257 by Sherol DadeMacMang, Natonya Finstad H, CRNA   Intraprocedure Flowsheets edited

## 2017-09-13 ENCOUNTER — Encounter: Payer: Self-pay | Admitting: Obstetrics & Gynecology

## 2017-09-17 LAB — SURGICAL PATHOLOGY

## 2019-07-02 ENCOUNTER — Encounter: Payer: Self-pay | Admitting: Certified Nurse Midwife

## 2019-07-02 ENCOUNTER — Other Ambulatory Visit: Payer: Self-pay

## 2019-07-02 ENCOUNTER — Ambulatory Visit: Payer: 59 | Admitting: Certified Nurse Midwife

## 2019-07-02 VITALS — BP 100/71 | HR 96 | Ht 67.0 in | Wt 196.3 lb

## 2019-07-02 DIAGNOSIS — F32A Depression, unspecified: Secondary | ICD-10-CM

## 2019-07-02 DIAGNOSIS — M797 Fibromyalgia: Secondary | ICD-10-CM

## 2019-07-02 DIAGNOSIS — Z3169 Encounter for other general counseling and advice on procreation: Secondary | ICD-10-CM | POA: Diagnosis not present

## 2019-07-02 DIAGNOSIS — Z818 Family history of other mental and behavioral disorders: Secondary | ICD-10-CM | POA: Diagnosis not present

## 2019-07-02 DIAGNOSIS — F329 Major depressive disorder, single episode, unspecified: Secondary | ICD-10-CM

## 2019-07-02 DIAGNOSIS — F419 Anxiety disorder, unspecified: Secondary | ICD-10-CM | POA: Diagnosis not present

## 2019-07-02 DIAGNOSIS — F4310 Post-traumatic stress disorder, unspecified: Secondary | ICD-10-CM

## 2019-07-02 NOTE — Progress Notes (Signed)
GYN ENCOUNTER NOTE  Subjective:       Beth Edwards is a 34 y.o. [place] female is here for gynecologic evaluation of the following issues:  1. Preconception Counseling. 2. Anxiety and Depression.   3. Genetic Screening.  Here to discuss timeline for getting pregnant. Desires two children. Currently working with Psychiatry and Pain management to wean off of Lyrica and Wellbutrin.  Has ParaGard IUD and would like to talk about when to remove to conceive.   Desires genetic screening prior to conception if possible. Reports family history of autism.   Denies difficulty breathing or respiratory distress, chest pain, abdominal pain, excessive vaginal bleeding, dysuria, leg pain or swelling   Gynecologic History  Patient's last menstrual period was 06/17/2019 (exact date).  Contraception: IUD, ParaGard   Last Pap: 09/04/16. Results were: Neg/neg  Obstetric History  OB History  Gravida Para Term Preterm AB Living  0 0 0 0 0 0  SAB TAB Ectopic Multiple Live Births  0 0 0 0 0    Past Medical History:  Diagnosis Date  . Anxiety   . Asthma   . Depression   . Fibromyalgia   . Headache   . Hypothyroidism   . PONV (postoperative nausea and vomiting)    after knee surgery was "very ill'    Past Surgical History:  Procedure Laterality Date  . CHROMOPERTUBATION  09/12/2017   Procedure: CHROMOPERTUBATION;  Surgeon: Ward, Honor Loh, MD;  Location: [facility] ORS;  Service: Gynecology;;  . KNEE ARTHROSCOPY Left   . LAPAROSCOPY N/A 09/12/2017   Procedure: LAPAROSCOPY OPERATIVE with peritoneal biopsies;  Surgeon: Ward, Honor Loh, MD;  Location: [facility] ORS;  Service: Gynecology;  Laterality: N/A;  . NASAL SINUS SURGERY    . WISDOM TOOTH EXTRACTION      Current Outpatient Medications on File Prior to Visit  Medication Sig Dispense Refill  . albuterol (PROVENTIL HFA;VENTOLIN HFA) 108 (90 Base) MCG/ACT inhaler Inhale 2 puffs into the lungs every 6 (six) hours as needed for wheezing or  shortness of breath.    Marland Kitchen buPROPion (WELLBUTRIN XL) 150 MG 24 hr tablet     . levocetirizine (XYZAL) 5 MG tablet Take 5 mg by mouth every evening.    Marland Kitchen levothyroxine (SYNTHROID, LEVOTHROID) 25 MCG tablet Take 25 mcg by mouth daily.  11  . naproxen (NAPROSYN) 500 MG tablet Take 500 mg by mouth at bedtime.    . ondansetron (ZOFRAN-ODT) 4 MG disintegrating tablet Take 4 mg by mouth every 8 (eight) hours as needed for nausea or vomiting.    Marland Kitchen PARAGARD INTRAUTERINE COPPER IU 1 Device by Intrauterine route once.    . pregabalin (LYRICA) 25 MG capsule Start at 125 mg daily in divided doses for 1 week, then decrease by 25 mg weekly as tolerated.    . prochlorperazine (COMPAZINE) 10 MG tablet     . rizatriptan (MAXALT) 10 MG tablet Take 10 mg by mouth as needed for migraine. May repeat in 2 hours if needed    . acetaminophen (TYLENOL) 500 MG tablet Take 1,000 mg by mouth 2 (two) times daily as needed for moderate pain or headache.    . Cyanocobalamin (B-12 COMPLIANCE INJECTION) 1000 MCG/ML KIT Inject 1,000 mcg as directed every 30 (thirty) days.    Marland Kitchen dicyclomine (BENTYL) 20 MG tablet Take 1 tablet (20 mg total) by mouth 3 (three) times daily as needed for spasms. (Patient not taking: Reported on 09/01/2017) 30 tablet 0  . DULoxetine (CYMBALTA) 60 MG capsule  Take 60 mg by mouth daily.    Marland Kitchen gabapentin (NEURONTIN) 300 MG capsule Take 600 mg by mouth 3 (three) times daily.    Marland Kitchen ibuprofen (ADVIL,MOTRIN) 800 MG tablet Take 1 tablet (800 mg total) by mouth every 6 (six) hours. 45 tablet 0  . LORazepam (ATIVAN) 0.5 MG tablet Take 0.5 mg by mouth 3 (three) times daily as needed for anxiety.    . Magnesium Citrate 125 MG CAPS Take 125 mg by mouth 2 (two) times a week.    . Melatonin 3 MG TABS Take 3 mg by mouth at bedtime.    . montelukast (SINGULAIR) 10 MG tablet Take 10 mg by mouth every evening.    . topiramate (TOPAMAX) 100 MG tablet Take 100 mg by mouth every evening. (Patient not taking: Reported on 07/02/2019)     . vitamin C (ASCORBIC ACID) 500 MG tablet Take 500 mg by mouth See admin instructions. Take 500 mg 5 times weekly    . Vitamin D, Ergocalciferol, (DRISDOL) 50000 units CAPS capsule Take 50,000 Units by mouth every Sunday.     No current facility-administered medications on file prior to visit.    Allergies  Allergen Reactions  . Ambien [Zolpidem Tartrate] Other (See Comments)    hallucinations  . Toradol [Ketorolac Tromethamine] Nausea And Vomiting    Panic attacks     Social History   Socioeconomic History  . Marital status: Married    Spouse name: Not on file  . Number of children: Not on file  . Years of education: Not on file  . Highest education level: Not on file  Occupational History  . Not on file  Tobacco Use  . Smoking status: Never Smoker  . Smokeless tobacco: Never Used  Vaping Use  . Vaping Use: Never used  Substance and Sexual Activity  . Alcohol use: Not Currently  . Drug use: Never  . Sexual activity: Yes    Birth control/protection: I.U.D.  Other Topics Concern  . Not on file  Social History Narrative  . Not on file   Social Determinants of Health   Financial Resource Strain:   . Difficulty of Paying Living Expenses:   Food Insecurity:   . Worried About Charity fundraiser in the Last Year:   . Arboriculturist in the Last Year:   Transportation Needs:   . Film/video editor (Medical):   Marland Kitchen Lack of Transportation (Non-Medical):   Physical Activity:   . Days of Exercise per Week:   . Minutes of Exercise per Session:   Stress:   . Feeling of Stress :   Social Connections:   . Frequency of Communication with Friends and Family:   . Frequency of Social Gatherings with Friends and Family:   . Attends Religious Services:   . Active Member of Clubs or Organizations:   . Attends Archivist Meetings:   Marland Kitchen Marital Status:   Intimate Partner Violence:   . Fear of Current or Ex-Partner:   . Emotionally Abused:   Marland Kitchen Physically Abused:    . Sexually Abused:     Family History  Problem Relation Age of Onset  . Diabetes Maternal Grandfather   . Breast cancer Paternal Grandmother     The following portions of the patient's history were reviewed and updated as appropriate: allergies, current medications, past family history, past medical history, past social history, past surgical history and problem list.  Review of Systems  ROS- negative except as noted  above. Information obtained from patient.  Objective:   BP 100/71   Pulse 96   Ht _0  (1.702 m)   Wt 196 lb 5 oz (89 kg)   LMP 06/17/2019 (Exact Date)   BMI 30.75 kg/m   CONSTITUTIONAL: Well-developed, well-nourished female in no acute distress.    PHYSICAL EXAM: Not Indicated.  Assessment:   1. Encounter for preconception consultation   2. Anxiety and depression   3. PTSD (post-traumatic stress disorder)   4. Family history of autism   5. Fibromyalgia   Plan:   Preconception counseling completed. All questions answered.  Encouraged to wait 1-2 months after stopping the Lyrica to remove IUD for planned pregnancy.  Discussed Wellbutrin use in pregnancy. Encouraged to continue Wellbutrin. Patient desires to work with her psychiatrist to wean.   Discussed [facility] screening for genetic testing preconception.  Reviewed red flag symptoms and when to call the office.  RTC x 2 months for ANNUAL EXAM / PAP / IUD REMOVAL  Or sooner if needed.  Fransico Him RN [place] 07/02/19 11:46 AM

## 2019-07-02 NOTE — Progress Notes (Signed)
I spent 30 minutes dedicated to the care of this patient on this date of this encounter to include pre-visit review of records, face to face time with the patient, and post visit ordering of testing.   I have seen, interviewed, and examined the patient in conjunction with the [employer] Nurse Practitioner student and affirm the diagnosis and management plan.  Gunnar Bulla, CNM [facility], [facility] 07/02/19 12:54 PM

## 2019-07-02 NOTE — Patient Instructions (Signed)
Perinatal Anxiety When a woman feels excessive tension or worry (anxiety) during pregnancy or during the first 12 months after she gives birth, she has a condition called perinatal anxiety. Anxiety can interfere with work, school, relationships, and other everyday activities. If it is not managed properly, it can also cause problems in the mother and her baby.  If you are pregnant and you have symptoms of an anxiety disorder, it is important to talk with your health care provider. What are the causes? The exact cause of this condition is not known. Hormonal changes during and after pregnancy may play a role in causing perinatal anxiety. What increases the risk? You are more likely to develop this condition if:  You have a personal or family history of depression, anxiety, or mood disorders.  You experience a stressful life event during pregnancy, such as the death of a loved one.  You have a lot of regular life stress, such as being a single parent.  You have thyroid problems. What are the signs or symptoms? Perinatal anxiety can be different for everyone. It may include:  Panic attacks (panic disorder). These are intense episodes of fear or discomfort that may also cause sweating, nausea, shortness of breath, or fear of dying. They usually last 5-15 minutes.  Reliving an upsetting (traumatic) event through distressing thoughts, dreams, or flashbacks (post-traumatic stress disorder, or PTSD).  Excessive worry about multiple problems (generalized anxiety disorder).  Fear and stress about leaving certain people or loved ones (separation anxiety).  Performing repetitive tasks (compulsions) to relieve stress or worry (obsessive compulsive disorder, or OCD).  Fear of certain objects or situations (phobias).  Excessive worrying, such as a constant feeling that something bad is going to happen.  Inability to relax.  Difficulty concentrating.  Sleep problems.  Frequent nightmares or  disturbing thoughts. How is this diagnosed? This condition is diagnosed based on a physical exam and mental evaluation. In some cases, your health care provider may use an anxiety screening tool. These tools include a list of questions that can help a health care provider diagnose anxiety. Your health care provider may refer you to a mental health expert who specializes in anxiety. How is this treated? This condition may be treated with:  Medicines. Your health care provider will only give you medicines that have been proven safe for pregnancy and breastfeeding.  Talk therapy with a mental health professional to help change your patterns of thinking (cognitive behavioral therapy).  Mindfulness-based stress reduction.  Other relaxation therapies, such as deep breathing or guided muscle relaxation.  Support groups. Follow these instructions at home: Lifestyle  Do not use any products that contain nicotine or tobacco, such as cigarettes and e-cigarettes. If you need help quitting, ask your health care provider.  Do not use alcohol when you are pregnant. After your baby is born, limit alcohol intake to no more than 1 drink a day. One drink equals 12 oz of beer, 5 oz of wine, or 1 oz of hard liquor.  Consider joining a support group for new mothers. Ask your health care provider for recommendations.  Take good care of yourself. Make sure you: ? Get plenty of sleep. If you are having trouble sleeping, talk with your health care provider. ? Eat a healthy diet. This includes plenty of fruits and vegetables, whole grains, and lean proteins. ? Exercise regularly, as told by your health care provider. Ask your health care provider what exercises are safe for you. General instructions  Take over-the-counter  and prescription medicines only as told by your health care provider.  Talk with your partner or family members about your feelings during pregnancy. Share any concerns or fears that you may  have.  Ask for help with tasks or chores when you need it. Ask friends and family members to provide meals, watch your children, or help with cleaning.  Keep all follow-up visits as told by your health care provider. This is important. Contact a health care provider if:  You (or people close to you) notice that you have any symptoms of anxiety or depression.  You have anxiety and your symptoms get worse.  You experience side effects from medicines, such as nausea or sleep problems. Get help right away if:  You feel like hurting yourself, your baby, or someone else. If you ever feel like you may hurt yourself or others, or have thoughts about taking your own life, get help right away. You can go to your nearest emergency department or call:  Your local emergency services (911 in the [place]  A suicide crisis helpline, such as the [facility] at 239-742-3812. This is open 24 hours a day. Summary  Perinatal anxiety is when a woman feels excessive tension or worry during pregnancy or during the first 12 months after she gives birth.  Perinatal anxiety may include panic attacks, post-traumatic stress disorder, separation anxiety, phobias, or generalized anxiety.  Perinatal anxiety can cause physical health problems in the mother and baby if not properly managed.  This condition is treated with medicines, talk therapy, stress reduction therapies, or a combination of two or more treatments.  Talk with your partner or family members about your concerns or fears. Do not be afraid to ask for help. This information is not intended to replace advice given to you by your health care provider. Make sure you discuss any questions you have with your health care provider. Document Revised: 01/10/2017 Document Reviewed: 03/06/2016 Elsevier Patient Education  2020 [employer].   Preparing for Pregnancy If you are considering becoming pregnant, make an appointment to see  your regular health care provider to learn how to prepare for a safe and healthy pregnancy (preconception care). During a preconception care visit, your health care provider will:  Do a complete physical exam, including a Pap test.  Take a complete medical history.  Give you information, answer your questions, and help you resolve problems. Preconception checklist Medical history  Tell your health care provider about any current or past medical conditions. Your pregnancy or your ability to become pregnant may be affected by chronic conditions, such as diabetes, chronic hypertension, and thyroid problems.  Include your family's medical history as well as your partner's medical history.  Tell your health care provider about any history of STIs (sexually transmitted infections).These can affect your pregnancy. In some cases, they can be passed to your baby. Discuss any concerns that you have about STIs.  If indicated, discuss the benefits of genetic testing. This testing will show whether there are any genetic conditions that may be passed from you or your partner to your baby.  Tell your health care provider about: ? Any problems you have had with conception or pregnancy. ? Any medicines you take. These include vitamins, herbal supplements, and over-the-counter medicines. ? Your history of immunizations. Discuss any vaccinations that you may need. Diet  Ask your health care provider what to include in a healthy diet that has a balance of nutrients. This is especially important when  you are pregnant or preparing to become pregnant.  Ask your health care provider to help you reach a healthy weight before pregnancy. ? If you are overweight, you may be at higher risk for certain complications, such as high blood pressure, diabetes, and preterm birth. ? If you are underweight, you are more likely to have a baby who has a low birth weight. Lifestyle, work, and home  Let your health care  provider know: ? About any lifestyle habits that you have, such as alcohol use, drug use, or smoking. ? About recreational activities that may put you at risk during pregnancy, such as downhill skiing and certain exercise programs. ? Tell your health care provider about any international travel, especially any travel to places with an active [place] virus outbreak. ? About harmful substances that you may be exposed to at work or at home. These include chemicals, pesticides, radiation, or even litter boxes. ? If you do not feel safe at home. Mental health  Tell your health care provider about: ? Any history of mental health conditions, including feelings of depression, sadness, or anxiety. ? Any medicines that you take for a mental health condition. These include herbs and supplements. Home instructions to prepare for pregnancy Lifestyle   Eat a balanced diet. This includes fresh fruits and vegetables, whole grains, lean meats, low-fat dairy products, healthy fats, and foods that are high in fiber. Ask to meet with a nutritionist or registered dietitian for assistance with meal planning and goals.  Get regular exercise. Try to be active for at least 30 minutes a day on most days of the week. Ask your health care provider which activities are safe during pregnancy.  Do not use any products that contain nicotine or tobacco, such as cigarettes and e-cigarettes. If you need help quitting, ask your health care provider.  Do not drink alcohol.  Do not take illegal drugs.  Maintain a healthy weight. Ask your health care provider what weight range is right for you. General instructions  Keep an accurate record of your menstrual periods. This makes it easier for your health care provider to determine your baby's due date.  Begin taking prenatal vitamins and folic acid supplements daily as directed by your health care provider.  Manage any chronic conditions, such as high blood pressure and  diabetes, as told by your health care provider. This is important. How do I know that I am pregnant? You may be pregnant if you have been sexually active and you miss your period. Symptoms of early pregnancy include:  Mild cramping.  Very light vaginal bleeding (spotting).  Feeling unusually tired.  Nausea and vomiting (morning sickness). If you have any of these symptoms and you suspect that you might be pregnant, you can take a home pregnancy test. These tests check for a hormone in your urine (human chorionic gonadotropin, or hCG). A woman's body begins to make this hormone during early pregnancy. These tests are very accurate. Wait until at least the first day after you miss your period to take one. If the test shows that you are pregnant (you get a positive result), call your health care provider to make an appointment for prenatal care. What should I do if I become pregnant?      Make an appointment with your health care provider as soon as you suspect you are pregnant.  Do not use any products that contain nicotine, such as cigarettes, chewing tobacco, and e-cigarettes. If you need help quitting, ask your  health care provider.  Do not drink alcoholic beverages. Alcohol is related to a number of birth defects.  Avoid toxic odors and chemicals.  You may continue to have sexual intercourse if it does not cause pain or other problems, such as vaginal bleeding. This information is not intended to replace advice given to you by your health care provider. Make sure you discuss any questions you have with your health care provider. Document Revised: 01/09/2017 Document Reviewed: 07/30/2015 Elsevier Patient Education  2020 [employer].

## 2019-08-30 IMAGING — CT CT ABD-PELV W/ CM
2 of 4 series · 16 of 46 positions shown, 18 images · IV contrast (APPLIED)
Comparison: None.

CLINICAL DATA: Right lower abdominal pain and nausea since [DATE]
p.m..

EXAM:
CT ABDOMEN AND PELVIS WITH CONTRAST
TECHNIQUE: Multidetector CT imaging of the abdomen and pelvis was performed
using the standard protocol following bolus administration of
intravenous contrast.
CONTRAST:  100mL OMNIPAQUE IOHEXOL 300 MG/ML  SOLN

[Series 2: routine abd/pel with · axial · 0.89mm/px · z∈[-744,-304]mm · 13 of 98 slices shown, 15 images]
[im 5/98  soft-tissue]
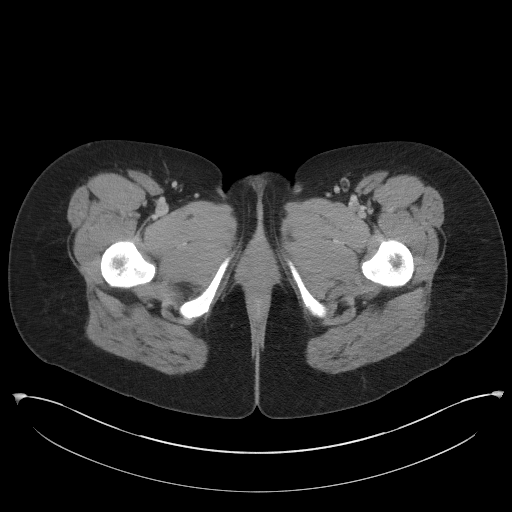
[im 5/98  bone]
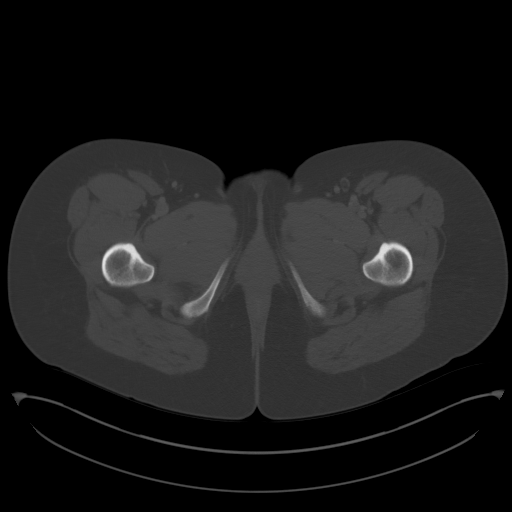
[im 13/98  soft-tissue]
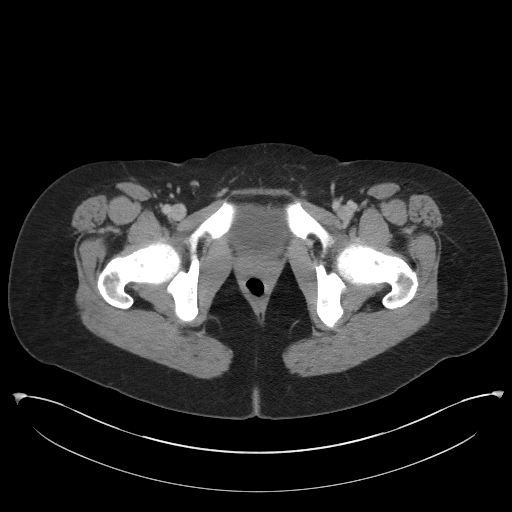
[im 22/98  soft-tissue]
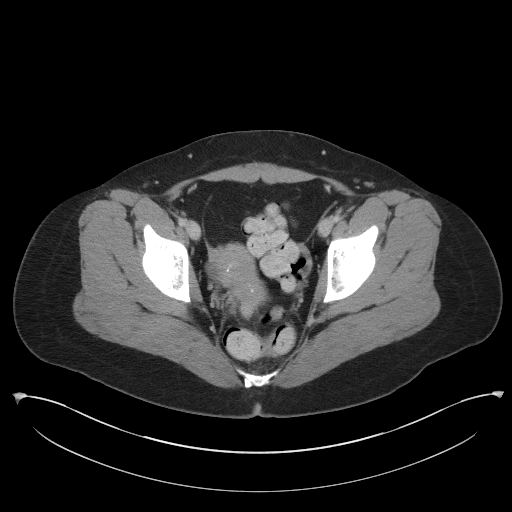
[im 26/98  soft-tissue]
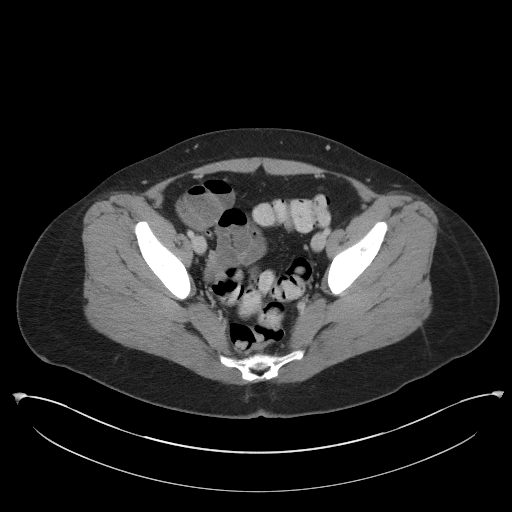
[im 34/98  soft-tissue]
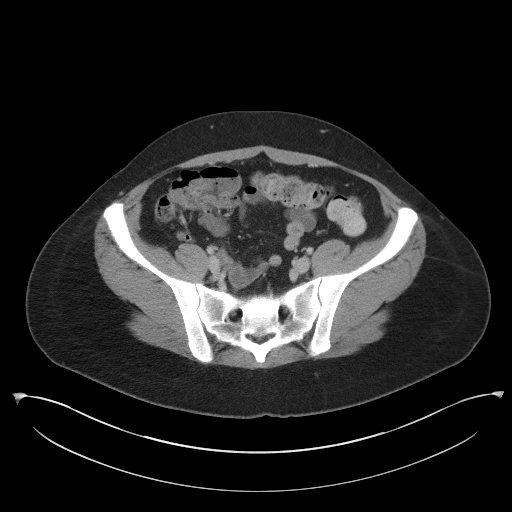
[im 43/98  soft-tissue]
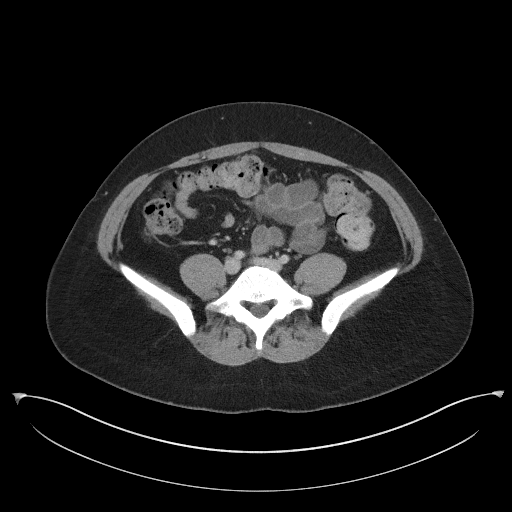
[im 51/98  soft-tissue]
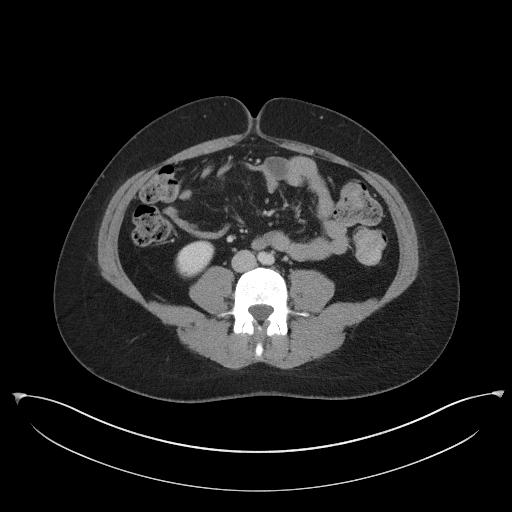
[im 55/98  soft-tissue]
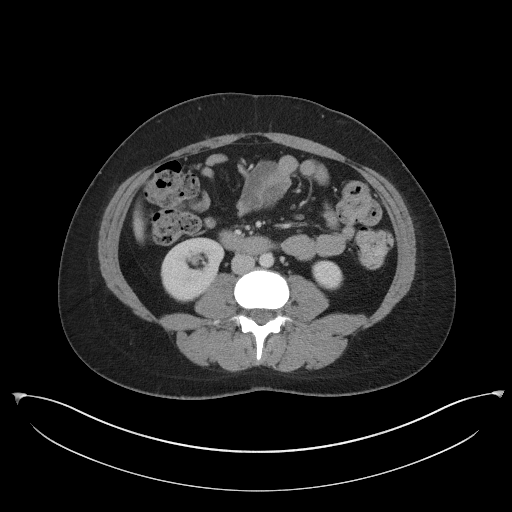
[im 64/98  soft-tissue]
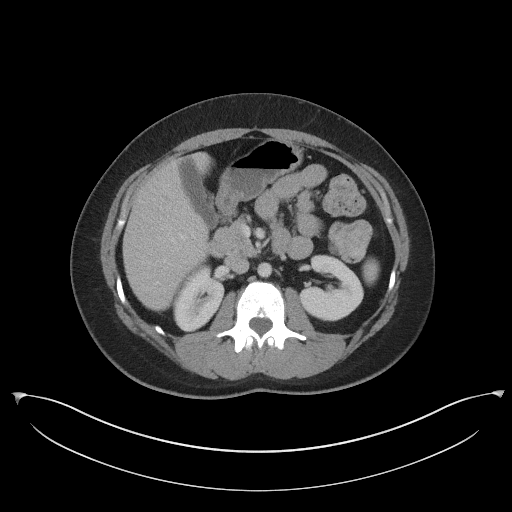
[im 64/98  bone]
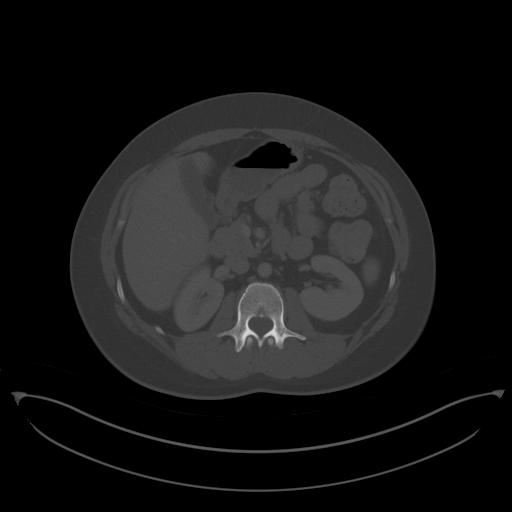
[im 72/98  soft-tissue]
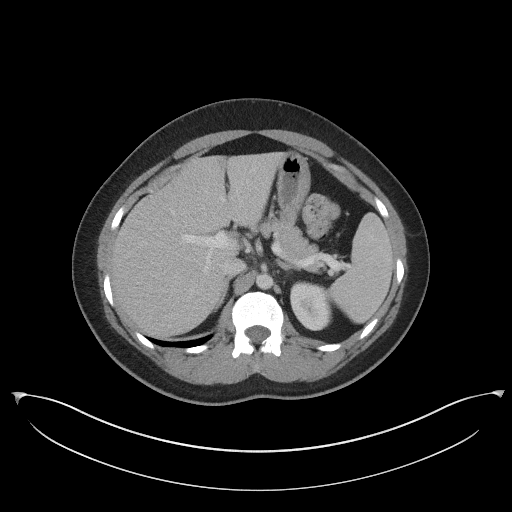
[im 76/98  soft-tissue]
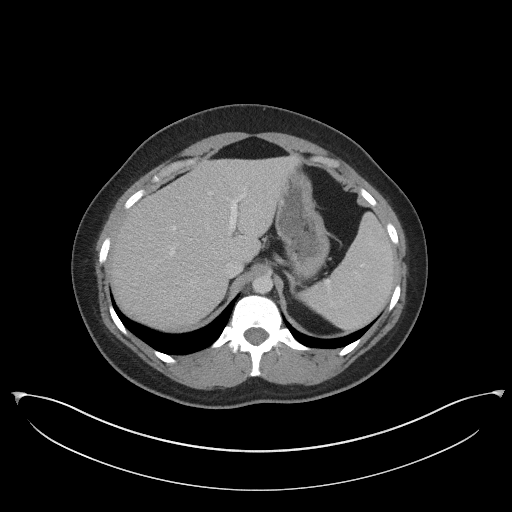
[im 85/98  soft-tissue]
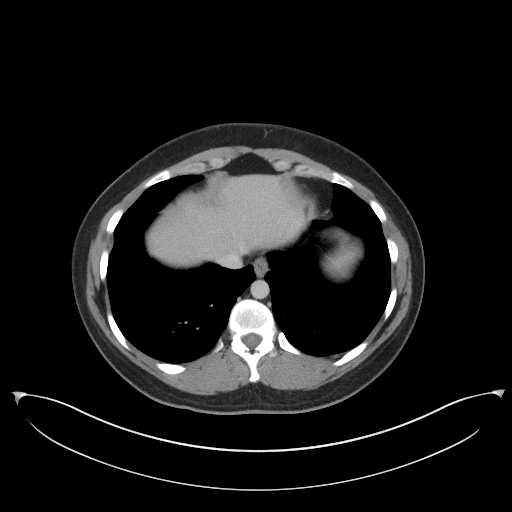
[im 93/98  soft-tissue]
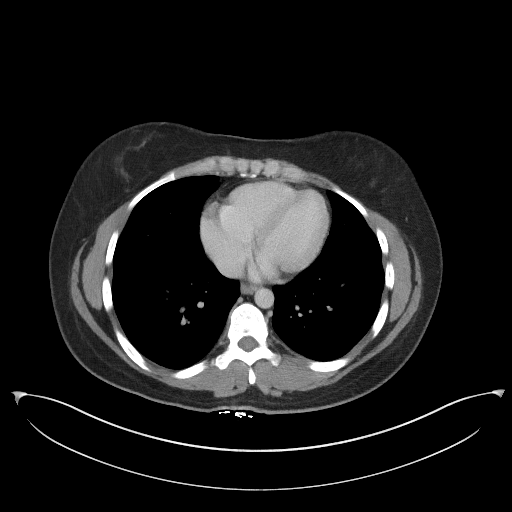

[Series 5: coronal st · coronal · 0.86mm/px · 3 of 90 slices shown]
[im 30/90  soft-tissue]
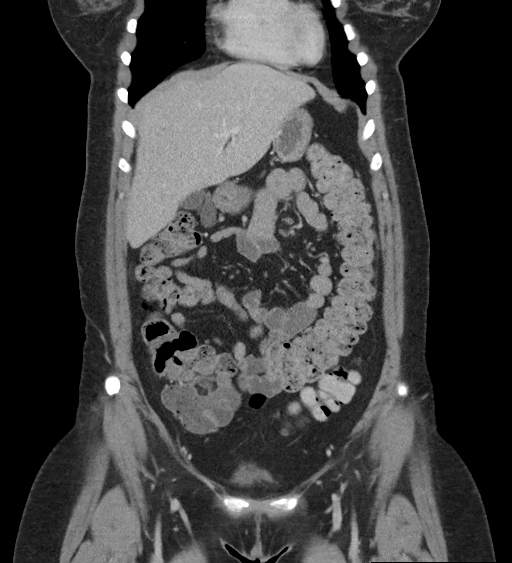
[im 40/90  soft-tissue]
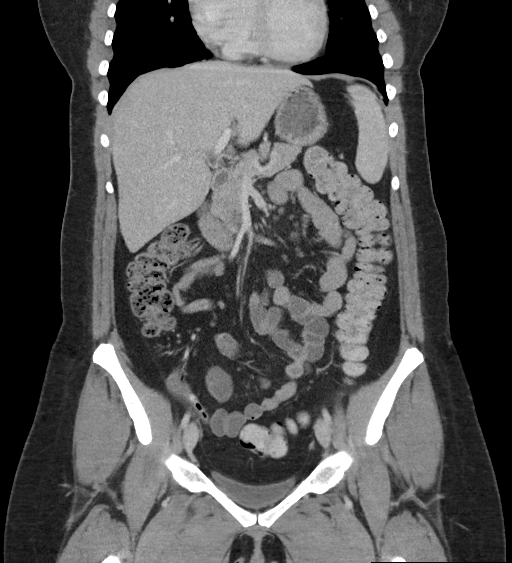
[im 50/90  soft-tissue]
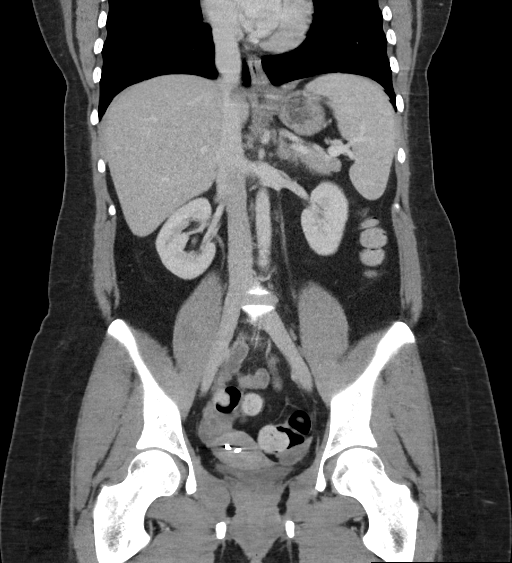

[16 of 46 positions shown; findings below may reference images not displayed]

FINDINGS: Lower chest: The lung bases are clear.

Hepatobiliary: No focal liver abnormality is seen. No gallstones,
gallbladder wall thickening, or biliary dilatation.

Pancreas: Unremarkable. No pancreatic ductal dilatation or
surrounding inflammatory changes.

Spleen: Normal in size without focal abnormality.

Adrenals/Urinary Tract: Adrenal glands are unremarkable. Kidneys are
normal, without renal calculi, focal lesion, or hydronephrosis.
Bladder is unremarkable.

Stomach/Bowel: Stomach, small bowel, and colon are not abnormally
distended. No wall thickening or inflammatory changes are
appreciated. Stool throughout the colon. Appendix is normal.

Vascular/Lymphatic: No significant vascular findings are present. No
enlarged abdominal or pelvic lymph nodes.

Reproductive: Uterus and ovaries are not enlarged. An intrauterine
device is in place. Position appears appropriate.

Other: No free air or free fluid in the abdomen. Abdominal wall
musculature appears intact.

Musculoskeletal: No acute or significant osseous findings.
IMPRESSION: No acute process demonstrated in the abdomen or pelvis. No evidence
of bowel obstruction or inflammation. Appendix is normal.

## 2019-09-06 ENCOUNTER — Other Ambulatory Visit: Payer: Self-pay

## 2019-09-06 ENCOUNTER — Ambulatory Visit (INDEPENDENT_AMBULATORY_CARE_PROVIDER_SITE_OTHER): Payer: 59 | Admitting: Certified Nurse Midwife

## 2019-09-06 ENCOUNTER — Encounter: Payer: Self-pay | Admitting: Certified Nurse Midwife

## 2019-09-06 ENCOUNTER — Other Ambulatory Visit (HOSPITAL_COMMUNITY)
Admission: RE | Admit: 2019-09-06 | Discharge: 2019-09-06 | Disposition: A | Discharge status: Home / Self Care | Payer: 59 | Source: Ambulatory Visit | Attending: Certified Nurse Midwife | Admitting: Certified Nurse Midwife

## 2019-09-06 VITALS — BP 102/68 | HR 86 | Ht 67.0 in | Wt 192.0 lb

## 2019-09-06 DIAGNOSIS — Z975 Presence of (intrauterine) contraceptive device: Secondary | ICD-10-CM | POA: Insufficient documentation

## 2019-09-06 DIAGNOSIS — Z124 Encounter for screening for malignant neoplasm of cervix: Secondary | ICD-10-CM

## 2019-09-06 DIAGNOSIS — Z01419 Encounter for gynecological examination (general) (routine) without abnormal findings: Secondary | ICD-10-CM

## 2019-09-06 NOTE — Progress Notes (Signed)
ANNUAL PREVENTATIVE CARE GYN  ENCOUNTER NOTE  Subjective:       Beth Edwards is a 34 y.o. G0P0000 female here for a routine annual gynecologic exam.  No questions or concerns today.   Denies difficulty breathing or respiratory distress, chest pain, abdominal pain, excessive vaginal bleeding, dysuria, and leg pain or swelling.    Gynecologic History  Patient's last menstrual period was 08/13/2019 (exact date). Period Cycle (Days): 30 Period Duration (Days): 5/6 Period Pattern: Regular (varies) Menstrual Flow: Heavy Menstrual Control: Tampon, Panty liner, Thin pad Menstrual Control Change Freq (Hours): 4 Dysmenorrhea: (!) Moderate Dysmenorrhea Symptoms: Cramping, Throbbing, Nausea, Diarrhea, Headache  Contraception: IUD, Paragard   Upstream - 09/06/19 1033      Pregnancy Intention Screening   Does the patient want to become pregnant in the next year? Unsure    Does the patient's partner want to become pregnant in the next year? Unsure    Would the patient like to discuss contraceptive options today? No          The pregnancy intention screening data noted above was reviewed. Potential methods of contraception were discussed. The patient elected to proceed with IUD or IUS.   Last Pap: 08/2016. Results were: Neg/Neg  Obstetric History  OB History  Gravida Para Term Preterm AB Living  0 0 0 0 0 0  SAB TAB Ectopic Multiple Live Births  0 0 0 0 0    Past Medical History:  Diagnosis Date  . Anxiety   . Asthma   . Depression   . Fibromyalgia   . Headache   . Hypothyroidism   . PONV (postoperative nausea and vomiting)    after knee surgery was "very ill'    Past Surgical History:  Procedure Laterality Date  . CHROMOPERTUBATION  09/12/2017   Procedure: CHROMOPERTUBATION;  Surgeon: Ward, Elenora Fender, MD;  Location: [facility] ORS;  Service: Gynecology;;  . KNEE ARTHROSCOPY Left   . LAPAROSCOPY N/A 09/12/2017   Procedure: LAPAROSCOPY OPERATIVE with peritoneal biopsies;   Surgeon: Ward, Elenora Fender, MD;  Location: [facility] ORS;  Service: Gynecology;  Laterality: N/A;  . NASAL SINUS SURGERY    . WISDOM TOOTH EXTRACTION      Current Outpatient Medications on File Prior to Visit  Medication Sig Dispense Refill  . albuterol (PROVENTIL HFA;VENTOLIN HFA) 108 (90 Base) MCG/ACT inhaler Inhale 2 puffs into the lungs every 6 (six) hours as needed for wheezing or shortness of breath.    Marland Kitchen buPROPion (WELLBUTRIN XL) 150 MG 24 hr tablet     . levocetirizine (XYZAL) 5 MG tablet Take 5 mg by mouth every evening.    Marland Kitchen levothyroxine (SYNTHROID, LEVOTHROID) 25 MCG tablet Take 25 mcg by mouth daily.  11  . naproxen (NAPROSYN) 500 MG tablet Take 500 mg by mouth at bedtime.    . ondansetron (ZOFRAN-ODT) 4 MG disintegrating tablet Take 4 mg by mouth every 8 (eight) hours as needed for nausea or vomiting.    Marland Kitchen PARAGARD INTRAUTERINE COPPER IU 1 Device by Intrauterine route once.    . prochlorperazine (COMPAZINE) 10 MG tablet     . rizatriptan (MAXALT) 10 MG tablet Take 10 mg by mouth as needed for migraine. May repeat in 2 hours if needed    . pregabalin (LYRICA) 25 MG capsule Start at 125 mg daily in divided doses for 1 week, then decrease by 25 mg weekly as tolerated. (Patient not taking: Reported on 09/06/2019)    . topiramate (TOPAMAX) 100 MG tablet Take  100 mg by mouth every evening. (Patient not taking: Reported on 07/02/2019)     No current facility-administered medications on file prior to visit.    Allergies  Allergen Reactions  . Ambien [Zolpidem Tartrate] Other (See Comments)    hallucinations  . Toradol [Ketorolac Tromethamine] Nausea And Vomiting    Panic attacks     Social History   Socioeconomic History  . Marital status: Married    Spouse name: Not on file  . Number of children: Not on file  . Years of education: Not on file  . Highest education level: Not on file  Occupational History  . Not on file  Tobacco Use  . Smoking status: Never Smoker  . Smokeless  tobacco: Never Used  Vaping Use  . Vaping Use: Never used  Substance and Sexual Activity  . Alcohol use: Yes    Alcohol/week: 1.0 standard drink    Types: 1 Cans of beer per week    Comment: ocassionally  . Drug use: Never  . Sexual activity: Yes    Birth control/protection: I.U.D.  Other Topics Concern  . Not on file  Social History Narrative  . Not on file   Social Determinants of Health   Financial Resource Strain:   . Difficulty of Paying Living Expenses:   Food Insecurity:   . Worried About Programme researcher, broadcasting/film/video in the Last Year:   . Barista in the Last Year:   Transportation Needs:   . Freight forwarder (Medical):   Marland Kitchen Lack of Transportation (Non-Medical):   Physical Activity:   . Days of Exercise per Week:   . Minutes of Exercise per Session:   Stress:   . Feeling of Stress :   Social Connections:   . Frequency of Communication with Friends and Family:   . Frequency of Social Gatherings with Friends and Family:   . Attends Religious Services:   . Active Member of Clubs or Organizations:   . Attends Banker Meetings:   Marland Kitchen Marital Status:   Intimate Partner Violence:   . Fear of Current or Ex-Partner:   . Emotionally Abused:   Marland Kitchen Physically Abused:   . Sexually Abused:     Family History  Problem Relation Age of Onset  . Diabetes Maternal Grandfather   . Breast cancer Paternal Grandmother     The following portions of the patient's history were reviewed and updated as appropriate: allergies, current medications, past family history, past medical history, past social history, past surgical history and problem list.  Review of Systems  ROS negative except as noted above. Information obtained from patient.    Objective:   BP 102/68   Pulse 86   Ht 5\' 7"  (1.702 m)   Wt 192 lb (87.1 kg)   LMP 08/13/2019 (Exact Date)   BMI 30.07 kg/m    CONSTITUTIONAL: Well-developed, well-nourished female in no acute distress, tearful.    NEUROLGIC: Alert and oriented to person, place, and time. Normal muscle tone coordination. No cranial nerve deficit noted.  HENT:  Normocephalic, atraumatic, External right and left ear normal.   EYES: Conjunctivae and EOM are normal. Pupils are equal and round.   NECK: Normal range of motion, supple, no masses.  Normal thyroid.   SKIN: Skin is warm and dry. No rash noted. Not diaphoretic. No erythema. No pallor.  CARDIOVASCULAR: Normal heart rate noted, regular rhythm, no murmur.  RESPIRATORY: Clear to auscultation bilaterally. Effort and breath sounds normal, no problems  with respiration noted.  BREASTS: Symmetric in size. No masses, skin changes, nipple drainage, or lymphadenopathy.  ABDOMEN: Soft, normal bowel sounds, no distention noted.  No tenderness, rebound or guarding.   PELVIC:  External Genitalia: Normal  Vagina: Normal  Cervix: Normal, Pap collected, IUD string visible  Uterus: Normal  Adnexa: Normal  MUSCULOSKELETAL: Normal range of motion. No tenderness.  No cyanosis, clubbing, or edema.  2+ distal pulses.  LYMPHATIC: No Axillary, Supraclavicular, or Inguinal Adenopathy.  Assessment:   Annual gynecologic examination 34 y.o.   Contraception: IUD, Paragard   Obesity 1   Problem List Items Addressed This Visit    None    Visit Diagnoses    Well woman exam    -  Primary   Encounter for IUD removal       Screening for cervical cancer          Plan:   Pap: Pap Co Test  Labs: Declined   Routine preventative health maintenance measures emphasized: Exercise/Diet/Weight control, Tobacco Warnings, Alcohol/Substance use risks and Stress Management; see AVS  Reviewed red flag symptoms and when to call  Return to Clinic - 1 Year for [facility] or sooner if needed   Serafina Royals, CNM  [facility], [facility] 09/06/19 10:41 AM

## 2019-09-06 NOTE — Patient Instructions (Signed)
Preventive Care 20-33 Years Old, Female Preventive care refers to visits with your health care provider and lifestyle choices that can promote health and wellness. This includes:  A yearly physical exam. This may also be called an annual well check.  Regular dental visits and eye exams.  Immunizations.  Screening for certain conditions.  Healthy lifestyle choices, such as eating a healthy diet, getting regular exercise, not using drugs or products that contain nicotine and tobacco, and limiting alcohol use. What can I expect for my preventive care visit? Physical exam Your health care provider will check your:  Height and weight. This may be used to calculate body mass index (BMI), which tells if you are at a healthy weight.  Heart rate and blood pressure.  Skin for abnormal spots. Counseling Your health care provider may ask you questions about your:  Alcohol, tobacco, and drug use.  Emotional well-being.  Home and relationship well-being.  Sexual activity.  Eating habits.  Work and work Statistician.  Method of birth control.  Menstrual cycle.  Pregnancy history. What immunizations do I need?  Influenza (flu) vaccine  This is recommended every year. Tetanus, diphtheria, and pertussis (Tdap) vaccine  You may need a Td booster every 10 years. Varicella (chickenpox) vaccine  You may need this if you have not been vaccinated. Human papillomavirus (HPV) vaccine  If recommended by your health care provider, you may need three doses over 6 months. Measles, mumps, and rubella (MMR) vaccine  You may need at least one dose of MMR. You may also need a second dose. Meningococcal conjugate (MenACWY) vaccine  One dose is recommended if you are age 75-21 years and a first-year college student living in a residence hall, or if you have one of several medical conditions. You may also need additional booster doses. Pneumococcal conjugate (PCV13) vaccine  You may need  this if you have certain conditions and were not previously vaccinated. Pneumococcal polysaccharide (PPSV23) vaccine  You may need one or two doses if you smoke cigarettes or if you have certain conditions. Hepatitis A vaccine  You may need this if you have certain conditions or if you travel or work in places where you may be exposed to hepatitis A. Hepatitis B vaccine  You may need this if you have certain conditions or if you travel or work in places where you may be exposed to hepatitis B. Haemophilus influenzae type b (Hib) vaccine  You may need this if you have certain conditions. You may receive vaccines as individual doses or as more than one vaccine together in one shot (combination vaccines). Talk with your health care provider about the risks and benefits of combination vaccines. What tests do I need?  Blood tests  Lipid and cholesterol levels. These may be checked every 5 years starting at age 33.  Hepatitis C test.  Hepatitis B test. Screening  Diabetes screening. This is done by checking your blood sugar (glucose) after you have not eaten for a while (fasting).  Sexually transmitted disease (STD) testing.  BRCA-related cancer screening. This may be done if you have a family history of breast, ovarian, tubal, or peritoneal cancers.  Pelvic exam and Pap test. This may be done every 3 years starting at age 76. Starting at age 102, this may be done every 5 years if you have a Pap test in combination with an HPV test. Talk with your health care provider about your test results, treatment options, and if necessary, the need for more tests.  Follow these instructions at home: Eating and drinking   Eat a diet that includes fresh fruits and vegetables, whole grains, lean protein, and low-fat dairy.  Take vitamin and mineral supplements as recommended by your health care provider.  Do not drink alcohol if: ? Your health care provider tells you not to drink. ? You are  pregnant, may be pregnant, or are planning to become pregnant.  If you drink alcohol: ? Limit how much you have to 0-1 drink a day. ? Be aware of how much alcohol is in your drink. In the [place] one drink equals one 12 oz bottle of beer (355 mL), one 5 oz glass of wine (148 mL), or one 1 oz glass of hard liquor (44 mL). Lifestyle  Take daily care of your teeth and gums.  Stay active. Exercise for at least 30 minutes on 5 or more days each week.  Do not use any products that contain nicotine or tobacco, such as cigarettes, e-cigarettes, and chewing tobacco. If you need help quitting, ask your health care provider.  If you are sexually active, practice safe sex. Use a condom or other form of birth control (contraception) in order to prevent pregnancy and STIs (sexually transmitted infections). If you plan to become pregnant, see your health care provider for a preconception visit. What's next?  Visit your health care provider once a year for a well check visit.  Ask your health care provider how often you should have your eyes and teeth checked.  Stay up to date on all vaccines. This information is not intended to replace advice given to you by your health care provider. Make sure you discuss any questions you have with your health care provider. Document Revised: 09/18/2017 Document Reviewed: 09/18/2017 Elsevier Patient Education  2020 [employer].

## 2019-09-08 LAB — CYTOLOGY - PAP
Adequacy: ABSENT
Comment: NEGATIVE
Diagnosis: NEGATIVE
High risk HPV: NEGATIVE

## 2019-11-03 ENCOUNTER — Telehealth: Payer: Self-pay

## 2019-11-03 NOTE — Telephone Encounter (Signed)
Per Asher Muir at [facility] lab-   Pap smear has been credited. Monia Pouch is out of network for [facility]. Part of [facility]. No further bills will be sent.   Pt aware.

## 2019-11-03 NOTE — Telephone Encounter (Signed)
Patient states she received a bill for per pap smear in 08/2019.  She was told that the pap was done in house. Had it been done in house her insurance would have taken care of it.   Had she know they were not she would have denied the pap.   Pt is upset and has cx all upcoming appts.   Spoke with Asher Muir at [facility] (amy is out) She has placed the acct on hold. Bonita Quin who helps [place] with the billing issues will research and get back we me. NO time frame given.  Pt is aware. Will contact pt when I hear from [facility].

## 2020-02-01 ENCOUNTER — Other Ambulatory Visit (HOSPITAL_COMMUNITY): Payer: Self-pay | Admitting: Student

## 2020-02-01 ENCOUNTER — Other Ambulatory Visit: Payer: Self-pay | Admitting: Student

## 2020-02-01 DIAGNOSIS — R220 Localized swelling, mass and lump, head: Secondary | ICD-10-CM

## 2020-02-07 ENCOUNTER — Ambulatory Visit: Payer: 59

## 2020-09-08 ENCOUNTER — Encounter: Payer: 59 | Admitting: Certified Nurse Midwife

## 2021-08-24 DIAGNOSIS — O09519 Supervision of elderly primigravida, unspecified trimester: Secondary | ICD-10-CM | POA: Insufficient documentation

## 2021-11-08 ENCOUNTER — Ambulatory Visit: Payer: Managed Care, Other (non HMO) | Attending: Certified Nurse Midwife

## 2021-11-08 DIAGNOSIS — M533 Sacrococcygeal disorders, not elsewhere classified: Secondary | ICD-10-CM | POA: Insufficient documentation

## 2021-11-08 DIAGNOSIS — M6281 Muscle weakness (generalized): Secondary | ICD-10-CM | POA: Insufficient documentation

## 2021-11-08 DIAGNOSIS — R278 Other lack of coordination: Secondary | ICD-10-CM | POA: Insufficient documentation

## 2021-11-08 NOTE — Therapy (Signed)
OUTPATIENT PHYSICAL THERAPY FEMALE PELVIC EVALUATION   Patient Name: Beth Edwards MRN: [id number] DOB:15-Nov-1985, 36 y.o., female Today's Date: 11/08/2021   PT End of Session - 11/08/21 0933     Visit Number 1    Number of Visits 10    Date for PT Re-Evaluation 01/17/22    Authorization Type IE: 11/08/21    PT Start Time 0935    PT Stop Time 0955    PT Time Calculation (min) 20 min    Activity Tolerance --             Past Medical History:  Diagnosis Date   Anxiety    Asthma    Depression    Fibromyalgia    Headache    Hypothyroidism    PONV (postoperative nausea and vomiting)    after knee surgery was "very ill'   Past Surgical History:  Procedure Laterality Date   CHROMOPERTUBATION  09/12/2017   Procedure: CHROMOPERTUBATION;  Surgeon: Ward, Honor Loh, MD;  Location: [facility] ORS;  Service: Gynecology;;   ENDOMETRIAL BIOPSY     KNEE ARTHROSCOPY Left    LAPAROSCOPY N/A 09/12/2017   Procedure: LAPAROSCOPY OPERATIVE with peritoneal biopsies;  Surgeon: Maceo Pro, MD;  Location: [facility] ORS;  Service: Gynecology;  Laterality: N/A;   NASAL SINUS SURGERY     WISDOM TOOTH EXTRACTION     Patient Active Problem List   Diagnosis Date Noted   IUD (intrauterine device) in place 09/06/2019   Pelvic pain in female 09/12/2017   Dysmenorrhea 09/12/2017    PCP: [facility] -per Pt  REFERRING PROVIDER: Linda Hedges, CNM   REFERRING DIAG:  [phone] (ICD-10-CM) - Other specified disorders of muscle   THERAPY DIAG:  Other lack of coordination  Muscle weakness (generalized)  Sacroiliac dysfunction  Rationale for Evaluation and Treatment: Rehabilitation  ONSET DATE: Since beginning of pregnancy   RED FLAGS: N/A  Have you had any night sweats? Unexplained weight loss? Saddle anesthesia? Unexplained changes in bowel or bladder habits?   SUBJECTIVE: Patient confirms identification and approves PT to assess pelvic floor and treatment Yes                                                                                                                                                                                            PRECAUTIONS: None  WEIGHT BEARING RESTRICTIONS: No  FALLS:  Has patient fallen in last 6 months? 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 Pt has felt hip/SIJ weakness and not stable from sitting to standing. Pt is [redacted] weeks pregnant and is due Mar 14, 2022. Pt is not having this pain with physical activity. Occasional round ligament pain. Pt notices more when in a single leg stance like standing in the shower, putting on clothing/shoes and getting in the car.    PAIN:  Are you having pain? Yes     OBJECTIVE:   DIAGNOSTIC TESTING/IMPRESSIONS:    COGNITION: Overall cognitive status:      POSTURE:  Grossly  Lumbar lordosis:   Thoracic kyphosis: Iliac crest height:  Lumbar lateral shift:  Pelvic obliquity:  Leg length discrepancy:    SENSATION: Light touch: , L2-S2 dermatomes  Proprioception:    RANGE OF MOTION:   (Norm range in degrees)  LEFT  RIGHT   Lumbar forward flexion (65):      Lumbar extension (30):     Lumbar lateral flexion (25):     Thoracic and Lumbar rotation (30 degrees):       Hip Flexion (0-125):      Hip IR (0-45):     Hip ER (0-45):     Hip Adduction:      Hip Abduction (0-40):     Hip extension (0-15):     (*= pain, Blank rows = not tested)   STRENGTH: MMT   RLE  LLE   Hip Flexion    Hip Extension    Hip Abduction     Hip Adduction     Hip ER     Hip IR     Knee Extension    Knee Flexion    Dorsiflexion     Plantarflexion (seated)    (*= pain, Blank rows = not  tested)   SPECIAL TESTS: Centralization and Peripheralization (SN 92, -LR 0.12):  Slump (SN 83, -LR 0.32):  SLR (SN 92, -LR 0.29): R: Lumbar quadrant (SN 70): R:  FABER (SN 81): FADIR (SN 94):  Hip scour (SN 50):  Thigh Thrust (SN 88, -LR 0.18) : Distraction (HK74):  Compression (SN/SP 69): Stork/March (SP 93):   PALPATION: Abdominal:  Diastasis:  finger above umbilicus,  fingers at and below umbilicus  Scar mobility: present/mobile perpendicular, parallel Rib flare: present/absent  EXTERNAL PELVIC EXAM: Patient educated on the purpose of the pelvic exam and articulated understanding; patient consented to the exam verbally. Palpation: Breath coordination: present/absent/inconsistent Voluntary Contraction: present/absent Relaxation: full/delayed/non-relaxing Perineal movement with sustained IAP increase ("bear down"): descent/no change/elevation/excessive descent Perineal movement with rapid IAP increase ("cough"): elevation/no change/descent Pubic symphysis: (0= no contraction, 1= flicker, 2= weak squeeze, 3= fair squeeze with lift, 4= good squeeze and lift against resistance, 5= strong squeeze against strong resistance)      ASSESSMENT:  Clinical Impression: Patient is a 36 y.o. who was seen today for physical therapy evaluation and treatment for a chief concern of SIJ/hip pain during pregnancy. Pt is 21 weeks and set to deliver March 14, 2022. After continued discussion on chief concern and modifications DPT offered (in the shower and car transfers) to reduce impact on the SIJ, Pt deferred further treatment and therefore discontinued initial evaluation. DPT offered a SIJ support belt as Pt reported minimal round ligament pain, but also deferred. Pt cancelled remaining appointments.      GOALS:  No goals created due to no completing a full evaluation.        Aulani Shipton  Lavinia Mcneely, PT, DPT  11/08/2021, 12:50 PM

## 2022-03-14 ENCOUNTER — Other Ambulatory Visit: Payer: Self-pay

## 2022-03-14 ENCOUNTER — Observation Stay
Admission: RE | Admit: 2022-03-14 | Discharge: 2022-03-14 | Disposition: A | Discharge status: Home / Self Care | Payer: Managed Care, Other (non HMO) | Source: Ambulatory Visit | Attending: Obstetrics and Gynecology | Admitting: Obstetrics and Gynecology

## 2022-03-14 DIAGNOSIS — Z3A40 40 weeks gestation of pregnancy: Secondary | ICD-10-CM | POA: Insufficient documentation

## 2022-03-14 DIAGNOSIS — Z3689 Encounter for other specified antenatal screening: Secondary | ICD-10-CM | POA: Insufficient documentation

## 2022-03-14 DIAGNOSIS — U071 COVID-19: Secondary | ICD-10-CM | POA: Insufficient documentation

## 2022-03-14 DIAGNOSIS — O09513 Supervision of elderly primigravida, third trimester: Secondary | ICD-10-CM | POA: Insufficient documentation

## 2022-03-14 DIAGNOSIS — O98513 Other viral diseases complicating pregnancy, third trimester: Secondary | ICD-10-CM | POA: Insufficient documentation

## 2022-03-14 NOTE — Discharge Summary (Signed)
Patient ID: Beth Edwards MRN: [id number] DOB/AGE: 09-19-1985 37 y.o.  Admit date: 03/14/2022 Discharge date: 03/14/2022  Admission Diagnoses: Pt sent to triage for an NST following positive covid result  Discharge Diagnoses: RNST  Factors complicating pregnancy: Varicella non-immune, offer Varivax postpartum Advanced Maternal Age (age 37 at delivery)    Prenatal Procedures: NST  Consults: None  Significant Diagnostic Studies:  No results found for this or any previous visit (from the past 168 hour(s)).  Treatments: none  Hospital Course:  This is a 37 y.o. G1P0000 with IUP at 20w0dseen for an NST following covid diagnosis in pregnancy.  NST reactive. She was deemed stable for discharge to home with outpatient follow up.  Discharge Physical Exam:  BP 133/76 (BP Location: Left Arm)   Pulse 96   Temp (!) 97.3 F (36.3 C) (Axillary)   Resp 18   NST: FHR baseline: 130 bpm Variability: moderate Accelerations: yes Decelerations: none Category/reactivity: reactive  TOCO: quiet SVE: deferred      Discharge Condition: Stable  Disposition: Discharge disposition: 01-Home or Self Care        Allergies as of 03/14/2022       Reactions   Ambien [zolpidem Tartrate] Other (See Comments)   hallucinations   Toradol [ketorolac Tromethamine] Nausea And Vomiting   Panic attacks         Medication List     STOP taking these medications    buPROPion 150 MG 24 hr tablet Commonly known as: WELLBUTRIN XL   levocetirizine 5 MG tablet Commonly known as: XYZAL   levothyroxine 25 MCG tablet Commonly known as: SYNTHROID   naproxen 500 MG tablet Commonly known as: NAPROSYN   ondansetron 4 MG disintegrating tablet Commonly known as: ZOFRAN-ODT   PARAGARD INTRAUTERINE COPPER IU   prochlorperazine 10 MG tablet Commonly known as: COMPAZINE       TAKE these medications    albuterol 108 (90 Base) MCG/ACT inhaler Commonly known as: VENTOLIN HFA Inhale 2  puffs into the lungs every 6 (six) hours as needed for wheezing or shortness of breath.   aspirin EC 81 MG tablet Take 81 mg by mouth daily. Swallow whole.   magnesium 30 MG tablet Take 30 mg by mouth 2 (two) times daily.   prenatal multivitamin Tabs tablet Take 1 tablet by mouth daily at 12 noon.   rizatriptan 10 MG tablet Commonly known as: MAXALT Take 10 mg by mouth as needed for migraine. May repeat in 2 hours if needed         Signed:  JRegina Eck2/22/2024 10:26 PM

## 2022-03-14 NOTE — OB Triage Note (Signed)
Patient is a 37 yo, G1P0, at 40 weeks 0 days. Patient presents to labor and delivery for NST after testing positive for COVID earlier this week.  Patient denies any vaginal bleeding or LOF. Patient reports +FM. Monitors applied and assessing. VSS. Initial fetal heart tone 135.

## 2022-03-16 ENCOUNTER — Observation Stay
Admission: EM | Admit: 2022-03-16 | Discharge: 2022-03-16 | Disposition: A | Discharge status: Home / Self Care | Payer: Managed Care, Other (non HMO) | Attending: Obstetrics | Admitting: Obstetrics

## 2022-03-16 ENCOUNTER — Other Ambulatory Visit: Payer: Self-pay

## 2022-03-16 ENCOUNTER — Encounter: Payer: Self-pay | Admitting: Obstetrics and Gynecology

## 2022-03-16 DIAGNOSIS — O99283 Endocrine, nutritional and metabolic diseases complicating pregnancy, third trimester: Secondary | ICD-10-CM | POA: Insufficient documentation

## 2022-03-16 DIAGNOSIS — O99513 Diseases of the respiratory system complicating pregnancy, third trimester: Secondary | ICD-10-CM | POA: Insufficient documentation

## 2022-03-16 DIAGNOSIS — O480 Post-term pregnancy: Principal | ICD-10-CM | POA: Diagnosis present

## 2022-03-16 DIAGNOSIS — O479 False labor, unspecified: Principal | ICD-10-CM | POA: Diagnosis present

## 2022-03-16 DIAGNOSIS — Z3A40 40 weeks gestation of pregnancy: Secondary | ICD-10-CM | POA: Insufficient documentation

## 2022-03-16 DIAGNOSIS — J45909 Unspecified asthma, uncomplicated: Secondary | ICD-10-CM | POA: Insufficient documentation

## 2022-03-16 DIAGNOSIS — O471 False labor at or after 37 completed weeks of gestation: Secondary | ICD-10-CM | POA: Diagnosis present

## 2022-03-16 DIAGNOSIS — O09513 Supervision of elderly primigravida, third trimester: Secondary | ICD-10-CM | POA: Insufficient documentation

## 2022-03-16 DIAGNOSIS — Z8616 Personal history of COVID-19: Secondary | ICD-10-CM | POA: Insufficient documentation

## 2022-03-16 DIAGNOSIS — E039 Hypothyroidism, unspecified: Secondary | ICD-10-CM | POA: Insufficient documentation

## 2022-03-16 DIAGNOSIS — Z79899 Other long term (current) drug therapy: Secondary | ICD-10-CM | POA: Insufficient documentation

## 2022-03-16 DIAGNOSIS — Z7982 Long term (current) use of aspirin: Secondary | ICD-10-CM | POA: Insufficient documentation

## 2022-03-16 LAB — TYPE AND SCREEN
ABO/RH(D): A POS
Antibody Screen: NEGATIVE

## 2022-03-16 LAB — CBC
HCT: 37.1 % (ref 36.0–46.0)
Hemoglobin: 12.8 g/dL (ref 12.0–15.0)
MCH: 30 pg (ref 26.0–34.0)
MCHC: 34.5 g/dL (ref 30.0–36.0)
MCV: 87.1 fL (ref 80.0–100.0)
Platelets: 241 10*3/uL (ref 150–400)
RBC: 4.26 MIL/uL (ref 3.87–5.11)
RDW: 12.6 % (ref 11.5–15.5)
WBC: 14 10*3/uL — ABNORMAL HIGH (ref 4.0–10.5)
nRBC: 0 % (ref 0.0–0.2)

## 2022-03-16 MED ORDER — OXYTOCIN 10 UNIT/ML IJ SOLN
INTRAMUSCULAR | Status: AC
  Filled 2022-03-16: qty 2

## 2022-03-16 MED ORDER — FENTANYL CITRATE (PF) 100 MCG/2ML IJ SOLN: 50.0000 ug | INTRAMUSCULAR | Status: DC | PRN

## 2022-03-16 MED ORDER — TERBUTALINE SULFATE 1 MG/ML IJ SOLN: 0.2500 mg | Freq: Once | INTRAMUSCULAR | Status: DC | PRN

## 2022-03-16 MED ORDER — MISOPROSTOL 200 MCG PO TABS
ORAL_TABLET | ORAL | Status: AC
  Filled 2022-03-16: qty 4

## 2022-03-16 MED ORDER — OXYTOCIN-SODIUM CHLORIDE 30-0.9 UT/500ML-% IV SOLN: 1.0000 m[IU]/min | INTRAVENOUS | Status: DC

## 2022-03-16 MED ORDER — LIDOCAINE HCL (PF) 1 % IJ SOLN
30.0000 mL | INTRAMUSCULAR | Status: DC | PRN
  Filled 2022-03-16: qty 30

## 2022-03-16 MED ORDER — LACTATED RINGERS IV SOLN: 500.0000 mL | INTRAVENOUS | Status: DC | PRN

## 2022-03-16 MED ORDER — ACETAMINOPHEN 325 MG PO TABS: 650.0000 mg | ORAL_TABLET | ORAL | Status: DC | PRN

## 2022-03-16 MED ORDER — ONDANSETRON HCL 4 MG/2ML IJ SOLN: 4.0000 mg | Freq: Four times a day (QID) | INTRAMUSCULAR | Status: DC | PRN

## 2022-03-16 MED ORDER — LACTATED RINGERS IV BOLUS
1000.0000 mL | Freq: Once | INTRAVENOUS | Status: AC
  Administered 2022-03-16: 1000 mL via INTRAVENOUS

## 2022-03-16 MED ORDER — AMMONIA AROMATIC IN INHA
RESPIRATORY_TRACT | Status: AC
  Filled 2022-03-16: qty 10

## 2022-03-16 MED ORDER — OXYTOCIN BOLUS FROM INFUSION: 333.0000 mL | Freq: Once | INTRAVENOUS | Status: DC

## 2022-03-16 MED ORDER — OXYTOCIN-SODIUM CHLORIDE 30-0.9 UT/500ML-% IV SOLN
2.5000 [IU]/h | INTRAVENOUS | Status: DC
  Filled 2022-03-16: qty 500

## 2022-03-16 MED ORDER — LACTATED RINGERS IV SOLN: INTRAVENOUS | Status: DC

## 2022-03-16 MED ORDER — SOD CITRATE-CITRIC ACID 500-334 MG/5ML PO SOLN: 30.0000 mL | ORAL | Status: DC | PRN

## 2022-03-16 NOTE — Progress Notes (Signed)
Pt presents to L/D triage with reported contractions that began this morning at 0600 and have begun to increase in frequency and intensity. Pt reports no bleeding or LOF and positive fetal movement. Monitors applied and assessing. Initial FHT 1236. SVE 1.5/70/-1. Vital signs stable.  CNM notified- pt PO hydrating. Labor eval per CNM.

## 2022-03-16 NOTE — Progress Notes (Signed)
Pt and husband feel strongly that they do not want any interventions to promote labor. They are well educated and understand the risks and benefits of choices of staying v going home. Pt wishes to go home and labor unrestricted. Discussed with pt pain scale and UCs and fetal movement. Pt and husband fel very confident in decision to go home. CNM aware and will discharge pt home with labor precautions. Pt feels her voice is being heard and is pleased with plan to labor at home and return for increased UC discomfort, spontaneous ROM, vaginal bleeding or decreased fetal movement. Discharged to private vehicle.

## 2022-03-16 NOTE — Progress Notes (Signed)
Patient wants to go home in prodromal labor. She desires low interventions during the labor process. She has been evaluated  and monitored in labor and delivery for > 10 hours with minimal cervical change. She has a category 1 tracing. Labor precautions and warning signs provided.  Dr Ouida Sills aware  Avelino Leeds CNM

## 2022-03-16 NOTE — Discharge Summary (Signed)
Patient ID: Beth Edwards MRN: [place]:[id number] DOB/AGE: Apr 15, 1985 37 y.o.  Admit date: 03/16/2022 Discharge date: 03/16/2022  Admission Diagnoses: latent labor  Discharge Diagnoses: latent labor  Factors complicating pregnancy: AMA Varicella non-immune  Prenatal Procedures: NST  Consults: none  Significant Diagnostic Studies:  Results for orders placed or performed during the hospital encounter of 03/16/22 (from the past 168 hour(s))  CBC   Collection Time: 03/16/22  6:43 PM  Result Value Ref Range   WBC 14.0 (H) 4.0 - 10.5 K/uL   RBC 4.26 3.87 - 5.11 MIL/uL   Hemoglobin 12.8 12.0 - 15.0 g/dL   HCT 37.1 36.0 - 46.0 %   MCV 87.1 80.0 - 100.0 fL   MCH 30.0 26.0 - 34.0 pg   MCHC 34.5 30.0 - 36.0 g/dL   RDW 12.6 11.5 - 15.5 %   Platelets 241 150 - 400 K/uL   nRBC 0.0 0.0 - 0.2 %  Type and screen [place]   Collection Time: 03/16/22  6:43 PM  Result Value Ref Range   ABO/RH(D) A POS    Antibody Screen NEG    Sample Expiration      03/19/2022,2359 Performed at [facility], [address], [place], [place] [address]     Treatments: IV hydration  Hospital Course:  This is a 37 y.o. G1P0000 with IUP at 24w2dadmitted for prodromal labor, noted to have a cervical exam of 2.5/80-1.  No leaking of fluid and no bleeding.    She was observed, fetal heart rate monitoring remained reassuring, and she had no signs/symptoms of progressing labor or other maternal-fetal concerns.  Her cervical exam was unchanged from admission.  She was deemed stable for discharge to home with outpatient follow up.  Discharge Physical Exam:  BP 132/76 (BP Location: Left Arm)   Pulse 92   Temp 98.4 F (36.9 C) (Oral)   Resp 18   Ht '5\' 7"'$  (1.702 m)   Wt 102.1 kg   BMI 35.24 kg/m   General: NAD CV: RRR Pulm: CTABL, nl effort ABD: s/nd/nt, gravid DVT Evaluation: LE non-ttp, no evidence of DVT on exam.  NST: FHR baseline: 135 bpm Variability:  moderate Accelerations: yes Decelerations: early Time: 20 minutes Category/reactivity: reactive   TOCO: quiet SVE: deferred  Dilation: 2.5 Effacement (%): 80 Cervical Position: Anterior Station: -1 Presentation: Vertex Exam by:: FAvelino LeedsCNM   Discharge Condition: Stable  Disposition: Discharge disposition: 01-Home or Self Care        Allergies as of 03/16/2022       Reactions   Ambien [zolpidem Tartrate] Other (See Comments)   hallucinations   Toradol [ketorolac Tromethamine] Nausea And Vomiting   Panic attacks         Medication List     STOP taking these medications    aspirin EC 81 MG tablet   rizatriptan 10 MG tablet Commonly known as: MAXALT       TAKE these medications    albuterol 108 (90 Base) MCG/ACT inhaler Commonly known as: VENTOLIN HFA Inhale 2 puffs into the lungs every 6 (six) hours as needed for wheezing or shortness of breath.   magnesium 30 MG tablet Take 30 mg by mouth 2 (two) times daily.   prenatal multivitamin Tabs tablet Take 1 tablet by mouth daily at 12 noon.         Signed:  FAvelino Leeds CNM 03/16/2022 11:14 PM

## 2022-03-16 NOTE — H&P (Signed)
OB History & Physical   History of Present Illness:   Chief Complaint: uterine contractions 3-4 min apart  HPI:  Beth Edwards is a 37 y.o. G1P0000 female at [redacted]w[redacted]d No LMP recorded. Patient is pregnant., consistent with [place]at 723w5dwith Estimated Date of Delivery: 03/14/22.  She presents to L&D for uterine contractions  Reports active fetal movement  Contractions: every 3 to 4 minutes LOF/SROM: intact Vaginal bleeding: none  Factors complicating pregnancy:  AMA Varicella non immune  Patient Active Problem List   Diagnosis Date Noted   Uterine contractions 03/16/2022   COVID-19 affecting pregnancy in third trimester 03/14/2022   High-risk pregnancy, elderly primigravida 08/24/2021   IUD (intrauterine device) in place 09/06/2019   Pelvic pain in female 09/12/2017   Dysmenorrhea 09/12/2017    Prenatal Transfer Tool  Maternal Diabetes: No Genetic Screening: Normal Maternal Ultrasounds/Referrals: Normal Fetal Ultrasounds or other Referrals:  None Maternal Substance Abuse:  No Significant Maternal Medications:  None Significant Maternal Lab Results: Group B Strep negative  Maternal Medical History:   Past Medical History:  Diagnosis Date   Anxiety    Asthma    Depression    Fibromyalgia    Headache    Hypothyroidism    PONV (postoperative nausea and vomiting)    after knee surgery was "very ill'    Past Surgical History:  Procedure Laterality Date   CHROMOPERTUBATION  09/12/2017   Procedure: CHROMOPERTUBATION;  Surgeon: Ward, ChHonor LohMD;  Location: [facility] ORS;  Service: Gynecology;;   ENDOMETRIAL BIOPSY     KNEE ARTHROSCOPY Left    LAPAROSCOPY N/A 09/12/2017   Procedure: LAPAROSCOPY OPERATIVE with peritoneal biopsies;  Surgeon: WaMaceo ProMD;  Location: [facility] ORS;  Service: Gynecology;  Laterality: N/A;   NASAL SINUS SURGERY     WISDOM TOOTH EXTRACTION      Allergies  Allergen Reactions   Ambien [Zolpidem Tartrate] Other (See Comments)     hallucinations   Toradol [Ketorolac Tromethamine] Nausea And Vomiting    Panic attacks     Prior to Admission medications   Medication Sig Start Date End Date Taking? Authorizing Provider  albuterol (PROVENTIL HFA;VENTOLIN HFA) 108 (90 Base) MCG/ACT inhaler Inhale 2 puffs into the lungs every 6 (six) hours as needed for wheezing or shortness of breath.   Yes [provider]  magnesium 30 MG tablet Take 30 mg by mouth 2 (two) times daily.   Yes [provider]  Prenatal Vit-Fe Fumarate-FA (PRENATAL MULTIVITAMIN) TABS tablet Take 1 tablet by mouth daily at 12 noon.   Yes [provider]  aspirin EC 81 MG tablet Take 81 mg by mouth daily. Swallow whole. Patient not taking: Reported on 03/16/2022    [provider]  rizatriptan (MAXALT) 10 MG tablet Take 10 mg by mouth as needed for migraine. May repeat in 2 hours if needed Patient not taking: Reported on 03/16/2022    [provider]     [facility]  OB History  Gravida Para Term Preterm AB Living  1 0 0 0 0 0  SAB IAB Ectopic Multiple Live Births  0 0 0 0 0    # Outcome Date GA Lbr Len/2nd Weight Sex Delivery Anes PTL Lv  1 Current              Social History: She  reports that she has never smoked. She has never used smokeless tobacco. She reports current alcohol use of about 1.0 standard  drink of alcohol per week. She reports that she does not use drugs.  Family History: family history includes Breast cancer in her paternal grandmother; Diabetes in her maternal grandfather.   Review of Systems: A full review of systems was performed and negative except as noted in the HPI.     Physical Exam:  Vital Signs: BP 129/79 (BP Location: Left Arm)   Pulse 98   Temp 98.3 F (36.8 C) (Oral)   Resp 18   Ht '5\' 7"'$  (1.702 m)   Wt 102.1 kg   BMI 35.24 kg/m   General: no acute distress.  HEENT: normocephalic, atraumatic Heart: regular rate & rhythm Lungs:  normal respiratory effort Abdomen: soft, gravid, non-tender;  EFW: 7lbs Pelvic:   External: Normal external female genitalia  Cervix: Dilation: 2.5 / Effacement (%): 80 / Station: -1    Extremities: non-tender, symmetric, no edema bilaterally.  DTRs: +2  Neurologic: Alert & oriented x 3.    No results found for this or any previous visit (from the past 24 hour(s)).  Pertinent Results:  Prenatal Labs: Blood type/Rh A POS Performed at [facility], [place], [place] [address]    Antibody screen Negative    Rubella immune    Varicella Not immune  RPR NR    HBsAg Neg   Hep C NR   HIV NR    GC neg  Chlamydia neg  Genetic screening cfDNA negative   1 hour GTT 111  3 hour GTT   GBS Neg     FHT:  FHR: 135 bpm, variability: moderate,  accelerations:  Present,  decelerations:  Absent Category/reactivity:  Category I UC:   irregular, every 3-5 minutes   Cephalic by Leopolds and SVE   No results found.  Assessment:  Beth Edwards is a 37 y.o. G1P0000 female at 71w2dwith AMA.   Plan:  1. Admit to Labor & Delivery - consents reviewed and obtained - Dr. SOuida Sillsnotified of admission and plan of care   2. Fetal Well being  - Fetal Tracing: category 1 - Group B Streptococcus ppx not indicated: GBS negative - Presentation: cephalic confirmed by SVE   3. Routine OB: - Prenatal labs reviewed, as above - Rh positive - CBC, T&S, RPR on admit - Clear liquid diet , continuous IV fluids  4. Monitoring of labor  - Contractions monitored with external toco - Pelvis  adequate for trial of labor  - Plan for expectant management  - Augmentation with oxytocin and AROM as appropriate  - Plan for  continuous fetal monitoring - Maternal pain control as desired; planning regional anesthesia, IVPM, low intervention birth options , and unmedicated labor support options  - Anticipate vaginal delivery  5. Post Partum Planning: - Infant feeding: breast  feeding - Contraception: condoms - Tdap vaccine: Given prenatally - Flu vaccine: Given prenatally -RSV vaccine: given prenatally  Ronith Berti LMarlaine Hind CNM [id number]  FAvelino Leeds CNM Certified Nurse Midwife [facility]

## 2022-03-17 ENCOUNTER — Inpatient Hospital Stay
Admission: EM | Admit: 2022-03-17 | Discharge: 2022-03-19 | DRG: 787 | Disposition: A | Discharge status: Home / Self Care | Payer: Managed Care, Other (non HMO) | Attending: Obstetrics | Admitting: Obstetrics

## 2022-03-17 ENCOUNTER — Encounter
Admission: EM | Disposition: A | Discharge status: Home / Self Care | Payer: Self-pay | Source: Home / Self Care | Attending: Obstetrics

## 2022-03-17 ENCOUNTER — Encounter: Payer: Self-pay | Admitting: Obstetrics and Gynecology

## 2022-03-17 ENCOUNTER — Inpatient Hospital Stay: Payer: Managed Care, Other (non HMO) | Admitting: Anesthesiology

## 2022-03-17 ENCOUNTER — Other Ambulatory Visit: Payer: Self-pay

## 2022-03-17 DIAGNOSIS — O99284 Endocrine, nutritional and metabolic diseases complicating childbirth: Secondary | ICD-10-CM | POA: Diagnosis present

## 2022-03-17 DIAGNOSIS — O480 Post-term pregnancy: Principal | ICD-10-CM | POA: Diagnosis present

## 2022-03-17 DIAGNOSIS — D62 Acute posthemorrhagic anemia: Secondary | ICD-10-CM | POA: Diagnosis not present

## 2022-03-17 DIAGNOSIS — E039 Hypothyroidism, unspecified: Secondary | ICD-10-CM | POA: Diagnosis present

## 2022-03-17 DIAGNOSIS — Z23 Encounter for immunization: Secondary | ICD-10-CM

## 2022-03-17 DIAGNOSIS — O9902 Anemia complicating childbirth: Secondary | ICD-10-CM | POA: Diagnosis present

## 2022-03-17 DIAGNOSIS — Z8616 Personal history of COVID-19: Secondary | ICD-10-CM

## 2022-03-17 DIAGNOSIS — Z3A40 40 weeks gestation of pregnancy: Secondary | ICD-10-CM

## 2022-03-17 LAB — RPR: RPR Ser Ql: NONREACTIVE

## 2022-03-17 SURGERY — Surgical Case
Anesthesia: Epidural

## 2022-03-17 MED ORDER — PHENYLEPHRINE 80 MCG/ML (10ML) SYRINGE FOR IV PUSH (FOR BLOOD PRESSURE SUPPORT): 80.0000 ug | PREFILLED_SYRINGE | INTRAVENOUS | Status: DC | PRN

## 2022-03-17 MED ORDER — OXYTOCIN-SODIUM CHLORIDE 30-0.9 UT/500ML-% IV SOLN
2.5000 [IU]/h | INTRAVENOUS | Status: DC
  Administered 2022-03-17: 2.5 [IU]/h via INTRAVENOUS

## 2022-03-17 MED ORDER — ACETAMINOPHEN 10 MG/ML IV SOLN
INTRAVENOUS | Status: AC
  Filled 2022-03-17: qty 100

## 2022-03-17 MED ORDER — PHENYLEPHRINE 80 MCG/ML (10ML) SYRINGE FOR IV PUSH (FOR BLOOD PRESSURE SUPPORT)
PREFILLED_SYRINGE | INTRAVENOUS | Status: DC | PRN
  Administered 2022-03-17 (×2): 160 ug via INTRAVENOUS

## 2022-03-17 MED ORDER — PROPOFOL 10 MG/ML IV BOLUS
INTRAVENOUS | Status: AC
  Filled 2022-03-17: qty 20

## 2022-03-17 MED ORDER — COCONUT OIL OIL
1.0000 | TOPICAL_OIL | Status: DC | PRN
  Filled 2022-03-17: qty 7.5

## 2022-03-17 MED ORDER — FENTANYL-BUPIVACAINE-NACL 0.5-0.125-0.9 MG/250ML-% EP SOLN
12.0000 mL/h | EPIDURAL | Status: DC | PRN
  Administered 2022-03-17: 12 mL/h via EPIDURAL

## 2022-03-17 MED ORDER — DEXAMETHASONE SODIUM PHOSPHATE 10 MG/ML IJ SOLN
INTRAMUSCULAR | Status: AC
  Filled 2022-03-17: qty 1

## 2022-03-17 MED ORDER — EPHEDRINE 5 MG/ML INJ: 10.0000 mg | INTRAVENOUS | Status: DC | PRN

## 2022-03-17 MED ORDER — ACETAMINOPHEN 10 MG/ML IV SOLN
INTRAVENOUS | Status: DC | PRN
  Administered 2022-03-17: 1000 mg via INTRAVENOUS

## 2022-03-17 MED ORDER — NALOXONE HCL 0.4 MG/ML IJ SOLN: 0.4000 mg | INTRAMUSCULAR | Status: DC | PRN

## 2022-03-17 MED ORDER — MORPHINE SULFATE (PF) 0.5 MG/ML IJ SOLN
INTRAMUSCULAR | Status: DC | PRN
  Administered 2022-03-17: 3 mg via EPIDURAL

## 2022-03-17 MED ORDER — LIDOCAINE-EPINEPHRINE (PF) 1.5 %-1:200000 IJ SOLN
INTRAMUSCULAR | Status: DC | PRN
  Administered 2022-03-17: 3 mL via EPIDURAL

## 2022-03-17 MED ORDER — MORPHINE SULFATE (PF) 0.5 MG/ML IJ SOLN
INTRAMUSCULAR | Status: AC
  Filled 2022-03-17: qty 10

## 2022-03-17 MED ORDER — WITCH HAZEL-GLYCERIN EX PADS: 1.0000 | MEDICATED_PAD | CUTANEOUS | Status: DC | PRN

## 2022-03-17 MED ORDER — BUPIVACAINE HCL (PF) 0.5 % IJ SOLN
INTRAMUSCULAR | Status: AC
  Filled 2022-03-17: qty 60

## 2022-03-17 MED ORDER — ACETAMINOPHEN 500 MG PO TABS
1000.0000 mg | ORAL_TABLET | Freq: Four times a day (QID) | ORAL | Status: AC
  Administered 2022-03-18 (×2): 1000 mg via ORAL
  Filled 2022-03-17 (×2): qty 2

## 2022-03-17 MED ORDER — METHYLERGONOVINE MALEATE 0.2 MG/ML IJ SOLN
INTRAMUSCULAR | Status: DC | PRN
  Administered 2022-03-17: .2 mg via INTRAMUSCULAR

## 2022-03-17 MED ORDER — ONDANSETRON HCL 4 MG/2ML IJ SOLN: 4.0000 mg | Freq: Three times a day (TID) | INTRAMUSCULAR | Status: DC | PRN

## 2022-03-17 MED ORDER — SOD CITRATE-CITRIC ACID 500-334 MG/5ML PO SOLN
ORAL | Status: AC
  Filled 2022-03-17: qty 15

## 2022-03-17 MED ORDER — PROPOFOL 10 MG/ML IV BOLUS
INTRAVENOUS | Status: DC | PRN
  Administered 2022-03-17: 200 mg via INTRAVENOUS

## 2022-03-17 MED ORDER — SODIUM CHLORIDE 0.9 % IV SOLN
500.0000 mg | INTRAVENOUS | Status: DC
  Administered 2022-03-17: 500 mg via INTRAVENOUS
  Filled 2022-03-17: qty 5

## 2022-03-17 MED ORDER — ONDANSETRON HCL 4 MG/2ML IJ SOLN: 4.0000 mg | Freq: Four times a day (QID) | INTRAMUSCULAR | Status: DC | PRN

## 2022-03-17 MED ORDER — TERBUTALINE SULFATE 1 MG/ML IJ SOLN
INTRAMUSCULAR | Status: AC
  Filled 2022-03-17: qty 1

## 2022-03-17 MED ORDER — BUPIVACAINE HCL (PF) 0.5 % IJ SOLN
INTRAMUSCULAR | Status: DC | PRN
  Administered 2022-03-17: 18 mL

## 2022-03-17 MED ORDER — SODIUM CHLORIDE 0.9% FLUSH: 3.0000 mL | INTRAVENOUS | Status: DC | PRN

## 2022-03-17 MED ORDER — TERBUTALINE SULFATE 1 MG/ML IJ SOLN
0.2500 mg | Freq: Once | INTRAMUSCULAR | Status: AC | PRN
  Administered 2022-03-17: 0.25 mg via SUBCUTANEOUS

## 2022-03-17 MED ORDER — VARICELLA VIRUS VACCINE LIVE 1350 PFU/0.5ML IJ SUSR
0.5000 mL | Freq: Once | INTRAMUSCULAR | Status: AC
  Administered 2022-03-19: 0.5 mL via SUBCUTANEOUS
  Filled 2022-03-17: qty 0.5

## 2022-03-17 MED ORDER — SCOPOLAMINE 1 MG/3DAYS TD PT72: 1.0000 | MEDICATED_PATCH | Freq: Once | TRANSDERMAL | Status: DC

## 2022-03-17 MED ORDER — CEFAZOLIN SODIUM-DEXTROSE 2-4 GM/100ML-% IV SOLN
2.0000 g | Freq: Once | INTRAVENOUS | Status: AC
  Administered 2022-03-17: 2 g via INTRAVENOUS

## 2022-03-17 MED ORDER — SUCCINYLCHOLINE CHLORIDE 200 MG/10ML IV SOSY
PREFILLED_SYRINGE | INTRAVENOUS | Status: AC
  Filled 2022-03-17: qty 10

## 2022-03-17 MED ORDER — ACETAMINOPHEN 325 MG PO TABS: 650.0000 mg | ORAL_TABLET | ORAL | Status: DC | PRN

## 2022-03-17 MED ORDER — GABAPENTIN 300 MG PO CAPS
300.0000 mg | ORAL_CAPSULE | Freq: Every day | ORAL | Status: DC
  Filled 2022-03-17: qty 1

## 2022-03-17 MED ORDER — IBUPROFEN 600 MG PO TABS
600.0000 mg | ORAL_TABLET | Freq: Four times a day (QID) | ORAL | Status: DC
  Administered 2022-03-17 – 2022-03-19 (×8): 600 mg via ORAL
  Filled 2022-03-17 (×8): qty 1

## 2022-03-17 MED ORDER — DEXAMETHASONE SODIUM PHOSPHATE 10 MG/ML IJ SOLN
INTRAMUSCULAR | Status: DC | PRN
  Administered 2022-03-17: 10 mg via INTRAVENOUS

## 2022-03-17 MED ORDER — LACTATED RINGERS IV SOLN: 500.0000 mL | Freq: Once | INTRAVENOUS | Status: DC

## 2022-03-17 MED ORDER — CEFAZOLIN SODIUM-DEXTROSE 2-4 GM/100ML-% IV SOLN
INTRAVENOUS | Status: AC
  Filled 2022-03-17: qty 100

## 2022-03-17 MED ORDER — DIPHENHYDRAMINE HCL 25 MG PO CAPS: 25.0000 mg | ORAL_CAPSULE | Freq: Four times a day (QID) | ORAL | Status: DC | PRN

## 2022-03-17 MED ORDER — LIDOCAINE HCL (PF) 1 % IJ SOLN: 30.0000 mL | INTRAMUSCULAR | Status: DC | PRN

## 2022-03-17 MED ORDER — SIMETHICONE 80 MG PO CHEW
80.0000 mg | CHEWABLE_TABLET | Freq: Three times a day (TID) | ORAL | Status: DC
  Administered 2022-03-17 – 2022-03-19 (×5): 80 mg via ORAL
  Filled 2022-03-17 (×6): qty 1

## 2022-03-17 MED ORDER — OXYTOCIN-SODIUM CHLORIDE 30-0.9 UT/500ML-% IV SOLN
2.5000 [IU]/h | INTRAVENOUS | Status: AC
  Filled 2022-03-17: qty 500

## 2022-03-17 MED ORDER — 0.9 % SODIUM CHLORIDE (POUR BTL) OPTIME
TOPICAL | Status: DC | PRN
  Administered 2022-03-17: 1000 mL

## 2022-03-17 MED ORDER — NALOXONE HCL 4 MG/10ML IJ SOLN: 1.0000 ug/kg/h | INTRAVENOUS | Status: DC | PRN

## 2022-03-17 MED ORDER — MEPERIDINE HCL 25 MG/ML IJ SOLN: 6.2500 mg | INTRAMUSCULAR | Status: DC | PRN

## 2022-03-17 MED ORDER — LACTATED RINGERS IV SOLN: 500.0000 mL | INTRAVENOUS | Status: DC | PRN

## 2022-03-17 MED ORDER — LIDOCAINE HCL (PF) 1 % IJ SOLN
INTRAMUSCULAR | Status: DC | PRN
  Administered 2022-03-17: 3 mL via SUBCUTANEOUS

## 2022-03-17 MED ORDER — ENOXAPARIN SODIUM 40 MG/0.4ML IJ SOSY
40.0000 mg | PREFILLED_SYRINGE | INTRAMUSCULAR | Status: DC
  Administered 2022-03-18 – 2022-03-19 (×2): 40 mg via SUBCUTANEOUS
  Filled 2022-03-17 (×3): qty 0.4

## 2022-03-17 MED ORDER — OXYCODONE HCL 5 MG PO TABS
5.0000 mg | ORAL_TABLET | Freq: Four times a day (QID) | ORAL | Status: DC | PRN
  Administered 2022-03-19: 5 mg via ORAL

## 2022-03-17 MED ORDER — FENTANYL CITRATE (PF) 100 MCG/2ML IJ SOLN: 50.0000 ug | INTRAMUSCULAR | Status: DC | PRN

## 2022-03-17 MED ORDER — FENTANYL-BUPIVACAINE-NACL 0.5-0.125-0.9 MG/250ML-% EP SOLN
EPIDURAL | Status: AC
  Filled 2022-03-17: qty 250

## 2022-03-17 MED ORDER — MENTHOL 3 MG MT LOZG: 1.0000 | LOZENGE | OROMUCOSAL | Status: DC | PRN

## 2022-03-17 MED ORDER — PRENATAL MULTIVITAMIN CH
1.0000 | ORAL_TABLET | Freq: Every day | ORAL | Status: DC
  Administered 2022-03-18: 1 via ORAL
  Filled 2022-03-17: qty 1

## 2022-03-17 MED ORDER — SUCCINYLCHOLINE CHLORIDE 200 MG/10ML IV SOSY
PREFILLED_SYRINGE | INTRAVENOUS | Status: DC | PRN
  Administered 2022-03-17: 140 mg via INTRAVENOUS

## 2022-03-17 MED ORDER — IBUPROFEN 600 MG PO TABS: 600.0000 mg | ORAL_TABLET | Freq: Four times a day (QID) | ORAL | Status: DC | PRN

## 2022-03-17 MED ORDER — BUPIVACAINE HCL (PF) 0.25 % IJ SOLN
INTRAMUSCULAR | Status: DC | PRN
  Administered 2022-03-17 (×2): 5 mL via EPIDURAL

## 2022-03-17 MED ORDER — LIDOCAINE HCL 2 % IJ SOLN
INTRAMUSCULAR | Status: AC
  Filled 2022-03-17: qty 10

## 2022-03-17 MED ORDER — OXYCODONE HCL 5 MG PO TABS
5.0000 mg | ORAL_TABLET | ORAL | Status: DC | PRN
  Administered 2022-03-18 – 2022-03-19 (×4): 5 mg via ORAL
  Filled 2022-03-17 (×5): qty 1

## 2022-03-17 MED ORDER — DIPHENHYDRAMINE HCL 25 MG PO CAPS: 25.0000 mg | ORAL_CAPSULE | ORAL | Status: DC | PRN

## 2022-03-17 MED ORDER — METHYLERGONOVINE MALEATE 0.2 MG/ML IJ SOLN
INTRAMUSCULAR | Status: AC
  Filled 2022-03-17: qty 1

## 2022-03-17 MED ORDER — ACETAMINOPHEN 500 MG PO TABS
1000.0000 mg | ORAL_TABLET | Freq: Four times a day (QID) | ORAL | Status: DC
  Administered 2022-03-17: 1000 mg via ORAL
  Filled 2022-03-17: qty 2

## 2022-03-17 MED ORDER — ONDANSETRON HCL 4 MG/2ML IJ SOLN
INTRAMUSCULAR | Status: DC | PRN
  Administered 2022-03-17: 4 mg via INTRAVENOUS

## 2022-03-17 MED ORDER — LACTATED RINGERS IV SOLN: INTRAVENOUS | Status: DC

## 2022-03-17 MED ORDER — DIPHENHYDRAMINE HCL 50 MG/ML IJ SOLN: 12.5000 mg | INTRAMUSCULAR | Status: DC | PRN

## 2022-03-17 MED ORDER — DIBUCAINE (PERIANAL) 1 % EX OINT: 1.0000 | TOPICAL_OINTMENT | CUTANEOUS | Status: DC | PRN

## 2022-03-17 MED ORDER — OXYTOCIN-SODIUM CHLORIDE 30-0.9 UT/500ML-% IV SOLN
1.0000 m[IU]/min | INTRAVENOUS | Status: DC
  Administered 2022-03-17: 30 [IU] via INTRAVENOUS
  Filled 2022-03-17 (×2): qty 500

## 2022-03-17 MED ORDER — ONDANSETRON HCL 4 MG/2ML IJ SOLN
INTRAMUSCULAR | Status: AC
  Filled 2022-03-17: qty 2

## 2022-03-17 MED ORDER — ONDANSETRON HCL 4 MG/2ML IJ SOLN
INTRAMUSCULAR | Status: AC
  Administered 2022-03-17: 4 mg via INTRAVENOUS
  Filled 2022-03-17: qty 2

## 2022-03-17 MED ORDER — CHLORHEXIDINE GLUCONATE 0.12 % MT SOLN
OROMUCOSAL | Status: AC
  Filled 2022-03-17: qty 15

## 2022-03-17 MED ORDER — SENNOSIDES-DOCUSATE SODIUM 8.6-50 MG PO TABS
2.0000 | ORAL_TABLET | Freq: Every day | ORAL | Status: DC
  Administered 2022-03-18 – 2022-03-19 (×2): 2 via ORAL
  Filled 2022-03-17 (×2): qty 2

## 2022-03-17 MED ORDER — SIMETHICONE 80 MG PO CHEW: 80.0000 mg | CHEWABLE_TABLET | ORAL | Status: DC | PRN

## 2022-03-17 MED ORDER — OXYTOCIN BOLUS FROM INFUSION: 333.0000 mL | Freq: Once | INTRAVENOUS | Status: DC

## 2022-03-17 MED ORDER — MORPHINE SULFATE (PF) 2 MG/ML IV SOLN: 1.0000 mg | INTRAVENOUS | Status: DC | PRN

## 2022-03-17 MED ORDER — SOD CITRATE-CITRIC ACID 500-334 MG/5ML PO SOLN: 30.0000 mL | ORAL | Status: DC | PRN

## 2022-03-17 SURGICAL SUPPLY — 28 items
BARRIER ADHS 3X4 INTERCEED (GAUZE/BANDAGES/DRESSINGS) ×1 IMPLANT
CHLORAPREP W/TINT 26 (MISCELLANEOUS) ×1 IMPLANT
DRSG TELFA 3X8 NADH STRL (GAUZE/BANDAGES/DRESSINGS) ×1 IMPLANT
ELECT CAUTERY BLADE 6.4 (BLADE) ×1 IMPLANT
ELECT REM PT RETURN 9FT ADLT (ELECTROSURGICAL) ×1
ELECTRODE REM PT RTRN 9FT ADLT (ELECTROSURGICAL) ×1 IMPLANT
GAUZE SPONGE 4X4 12PLY STRL (GAUZE/BANDAGES/DRESSINGS) ×1 IMPLANT
GLOVE SURG SYN 8.0 (GLOVE) ×1 IMPLANT
GLOVE SURG SYN 8.0 PF PI (GLOVE) ×1 IMPLANT
GOWN STRL REUS W/ TWL LRG LVL3 (GOWN DISPOSABLE) ×2 IMPLANT
GOWN STRL REUS W/ TWL XL LVL3 (GOWN DISPOSABLE) ×1 IMPLANT
GOWN STRL REUS W/TWL LRG LVL3 (GOWN DISPOSABLE) ×2
GOWN STRL REUS W/TWL XL LVL3 (GOWN DISPOSABLE) ×1
MANIFOLD NEPTUNE II (INSTRUMENTS) ×1 IMPLANT
MAT PREVALON FULL STRYKER (MISCELLANEOUS) ×1 IMPLANT
NEEDLE HYPO 22GX1.5 SAFETY (NEEDLE) ×1 IMPLANT
NS IRRIG 1000ML POUR BTL (IV SOLUTION) ×1 IMPLANT
PACK C SECTION AR (MISCELLANEOUS) ×1 IMPLANT
PAD OB MATERNITY 4.3X12.25 (PERSONAL CARE ITEMS) ×1 IMPLANT
PAD PREP 24X41 OB/GYN DISP (PERSONAL CARE ITEMS) ×1 IMPLANT
SCRUB CHG 4% DYNA-HEX 4OZ (MISCELLANEOUS) ×1 IMPLANT
STRAP SAFETY 5IN WIDE (MISCELLANEOUS) ×1 IMPLANT
SUT CHROMIC 1 CTX 36 (SUTURE) ×3 IMPLANT
SUT PLAIN GUT 0 (SUTURE) ×2 IMPLANT
SUT VIC AB 0 CT1 36 (SUTURE) ×2 IMPLANT
SYR 30ML LL (SYRINGE) ×2 IMPLANT
TRAP FLUID SMOKE EVACUATOR (MISCELLANEOUS) ×1 IMPLANT
WATER STERILE IRR 500ML POUR (IV SOLUTION) ×1 IMPLANT

## 2022-03-17 NOTE — Op Note (Signed)
NAMESHALETHA, TRA MEDICAL RECORD NO: [place]:[id number] ACCOUNT NO: [id number] DATE OF BIRTH: 1985-05-15 FACILITY: [facility] LOCATION: [facility] PHYSICIAN: Boykin Nearing, MD  Operative Report   DATE OF PROCEDURE: 03/17/2022  PREOPERATIVE DIAGNOSES:   1.  40+3 weeks estimated gestational age. 2.  Fetal intolerance to labor with recurrent fetal bradycardic episodes. 3.  Thick meconium staining.  POSTOPERATIVE DIAGNOSES:   1.  40+3 weeks estimated gestational age. 2.  Fetal intolerance to labor with recurrent fetal bradycardic episodes. 3.  Thick meconium staining. 4. Viable female delivered.  PROCEDURE:  Emergent low transverse cesarean section.  ANESTHESIA:  General endotracheal anesthesia.  SURGEON:  Boykin Nearing, MD  FIRST ASSISTANT:  Avelino Leeds, certified nurse midwife.  INDICATIONS:  A 37 year old gravida 1, para 0 patient was brought in on 03/16/2022 for induction of labor, which she did not make any cervical change and left the same day and returned on 03/17/2022 at 3:00 in the morning for restarting induction of  labor.  Fetal monitoring initially on admission was category 1 and over the next 4 hours, decompensated to category 3 with recurrent late decelerations and recurrent bradycardic episodes.  DESCRIPTION OF PROCEDURE:  The patient was emergently taken back to the operating room and the patient's abdomen was prepped and draped in normal sterile fashion.  Timeout was performed.  Dr. Wynetta Emery, the anesthesiologist with certified nurse  anesthetist, administered general anesthetic.  A Pfannenstiel incision was made 2 fingerbreadths above the symphysis pubis.  Sharp dissection was used to identify the fascia.  Fascia was opened in the midline and in a transverse fashion.  Recti muscles  were dissected free.  The peritoneal cavity was entered sharply and a bladder blade was placed and a direct low uterine segment incision was made.  Thick meconium resulted when  entering the endometrial cavity.  The fetal head and shoulders were then  delivered through the incision and the cord was doubly clamped and female with initial spontaneous cry was then passed to nursery staff who assigned Apgar scores of 8 and 9.  Arterial cord gas 7.27, CO2 of 59, bicarbonate of 27. The placenta was then  manually delivered and the uterus was exteriorized.  The endometrial cavity was wiped clean with laparotomy tape.  Intravenous Pitocin was administered and the uterus demonstrated some atony. Therefore, the patient received 0.2 mg intramuscular  Methergine.  Uterine incision was closed with 1 chromic suture in a running locking fashion. Good approximation of edges.  Additional figure-of-eight sutures required for hemostasis.  Fallopian tubes and ovaries appeared normal.  Posterior cul-de-sac was  irrigated and suctioned.  Uterus was placed back into the abdominal cavity and the pericolic gutters were then wiped clean.  Uterine incision appeared hemostatic and Interceed was placed over the incision in T-shape fashion.  Fascia was then closed with  0 Vicryl suture in a running nonlocking fashion, two separate sutures were used.  Fascial edges were injected with a solution of 60 mL 0.5% Marcaine  plus 40 mL normal saline.  Approximately 20 mL of this solution was used for injection.  Subcutaneous  tissues were irrigated and bovied for hemostasis and the skin was reapproximated with Insorb absorbable staples and additional 10 mL of the Marcaine solution was injected beneath the skin.  The patient did receive 2 grams of IV Ancef prior to  commencement during the procedure and received 500 mg azithromycin at the end of the case.  Both of these were being used for surgical prophylaxis.  The patient  tolerated the procedure well and was taken to recovery room in good condition.  QUANTITATIVE BLOOD LOSS: 1150 mL  INTRAOPERATIVE FLUIDS:  300 mL  URINE OUTPUT:  100 mL  The patient tolerated  the procedure well and was taken to recovery room in good condition.     PAA D: 03/17/2022 9:31:17 am T: 03/17/2022 9:48:00 am  JOB: [id number]

## 2022-03-17 NOTE — Transfer of Care (Signed)
Immediate Anesthesia Transfer of Care Note  Patient: Beth Edwards  Procedure(s) Performed: CESAREAN SECTION  Patient Location: Mother/Baby  Anesthesia Type:General  Level of Consciousness: awake, alert , and oriented  Airway & Oxygen Therapy: Patient Spontanous Breathing  Post-op Assessment: Report given to RN and Post -op Vital signs reviewed and stable  Post vital signs: Reviewed and unstable  Last Vitals:  Vitals Value Taken Time  BP    Temp    Pulse    Resp    SpO2      Last Pain: There were no vitals filed for this visit.       Complications: No notable events documented.  Ordered 700cc LR bolus for tachycardia

## 2022-03-17 NOTE — Anesthesia Procedure Notes (Signed)
Procedure Name: Intubation Date/Time: 03/17/2022 7:23 AM  Performed by: Garner Nash, CRNAPre-anesthesia Checklist: Patient identified, Emergency Drugs available, Suction available and Patient being monitored Patient Re-evaluated:Patient Re-evaluated prior to induction Oxygen Delivery Method: Circle system utilized Preoxygenation: Pre-oxygenation with 100% oxygen Induction Type: IV induction Laryngoscope Size: McGraph and 3 Grade View: Grade II Tube type: Oral Tube size: 6.5 mm Number of attempts: 1 Airway Equipment and Method: Stylet Placement Confirmation: ETT inserted through vocal cords under direct vision, positive ETCO2 and breath sounds checked- equal and bilateral Secured at: 22 cm Tube secured with: Tape Dental Injury: Teeth and Oropharynx as per pre-operative assessment

## 2022-03-17 NOTE — H&P (Signed)
OB History & Physical   History of Present Illness:   Chief Complaint: uterine contractions 3-4 min apart  HPI:  Beth Edwards is a 37 y.o. G1P0000 female at [redacted]w[redacted]d No LMP recorded. Patient is pregnant., consistent with [place]at 744w5dwith Estimated Date of Delivery: 03/14/22.  She presents to L&D for uterine contractions  Reports active fetal movement  Contractions: every 3 to 4 minutes LOF/SROM: intact Vaginal bleeding: none  Factors complicating pregnancy:  AMA Varicella non immune  Patient Active Problem List   Diagnosis Date Noted   Normal labor and delivery 03/17/2022   Uterine contractions 03/16/2022   COVID-19 affecting pregnancy in third trimester 03/14/2022   High-risk pregnancy, elderly primigravida 08/24/2021   IUD (intrauterine device) in place 09/06/2019   Pelvic pain in female 09/12/2017   Dysmenorrhea 09/12/2017    Prenatal Transfer Tool  Maternal Diabetes: No Genetic Screening: Normal Maternal Ultrasounds/Referrals: Normal Fetal Ultrasounds or other Referrals:  None Maternal Substance Abuse:  No Significant Maternal Medications:  None Significant Maternal Lab Results: Group B Strep negative  Maternal Medical History:   Past Medical History:  Diagnosis Date   Anxiety    Asthma    Depression    Fibromyalgia    Headache    Hypothyroidism    PONV (postoperative nausea and vomiting)    after knee surgery was "very ill'    Past Surgical History:  Procedure Laterality Date   CHROMOPERTUBATION  09/12/2017   Procedure: CHROMOPERTUBATION;  Surgeon: Ward, ChHonor LohMD;  Location: [facility] ORS;  Service: Gynecology;;   ENDOMETRIAL BIOPSY     KNEE ARTHROSCOPY Left    LAPAROSCOPY N/A 09/12/2017   Procedure: LAPAROSCOPY OPERATIVE with peritoneal biopsies;  Surgeon: WaMaceo ProMD;  Location: [facility] ORS;  Service: Gynecology;  Laterality: N/A;   NASAL SINUS SURGERY     WISDOM TOOTH EXTRACTION      Allergies  Allergen Reactions   Ambien [Zolpidem  Tartrate] Other (See Comments)    hallucinations   Toradol [Ketorolac Tromethamine] Nausea And Vomiting    Panic attacks     Prior to Admission medications   Medication Sig Start Date End Date Taking? Authorizing Provider  albuterol (PROVENTIL HFA;VENTOLIN HFA) 108 (90 Base) MCG/ACT inhaler Inhale 2 puffs into the lungs every 6 (six) hours as needed for wheezing or shortness of breath.   Yes [provider]  magnesium 30 MG tablet Take 30 mg by mouth 2 (two) times daily.   Yes [provider]  Prenatal Vit-Fe Fumarate-FA (PRENATAL MULTIVITAMIN) TABS tablet Take 1 tablet by mouth daily at 12 noon.   Yes [provider]  aspirin EC 81 MG tablet Take 81 mg by mouth daily. Swallow whole. Patient not taking: Reported on 03/16/2022    [provider]  rizatriptan (MAXALT) 10 MG tablet Take 10 mg by mouth as needed for migraine. May repeat in 2 hours if needed Patient not taking: Reported on 03/16/2022    [provider]     [facility]  OB History  Gravida Para Term Preterm AB Living  1 0 0 0 0 0  SAB IAB Ectopic Multiple Live Births  0 0 0 0 0    # Outcome Date GA Lbr Len/2nd Weight Sex Delivery Anes PTL Lv  1 Current              Social History: She  reports that she has never smoked. She has never used smokeless tobacco. She reports  current alcohol use of about 1.0 standard drink of alcohol per week. She reports that she does not use drugs.  Family History: family history includes Breast cancer in her paternal grandmother; Diabetes in her maternal grandfather.   Review of Systems: A full review of systems was performed and negative except as noted in the HPI.     Physical Exam:  Vital Signs: There were no vitals taken for this visit.  General: no acute distress.  HEENT: normocephalic, atraumatic Heart: regular rate & rhythm Lungs: normal respiratory effort Abdomen: soft, gravid, non-tender;  EFW:  7lbs Pelvic:   External: Normal external female genitalia  Cervix: Dilation: 3.5 / Effacement (%): 80 / Station: -1    Extremities: non-tender, symmetric, no edema bilaterally.  DTRs: +2  Neurologic: Alert & oriented x 3.    Results for orders placed or performed during the hospital encounter of 03/16/22 (from the past 24 hour(s))  CBC     Status: Abnormal   Collection Time: 03/16/22  6:43 PM  Result Value Ref Range   WBC 14.0 (H) 4.0 - 10.5 K/uL   RBC 4.26 3.87 - 5.11 MIL/uL   Hemoglobin 12.8 12.0 - 15.0 g/dL   HCT 37.1 36.0 - 46.0 %   MCV 87.1 80.0 - 100.0 fL   MCH 30.0 26.0 - 34.0 pg   MCHC 34.5 30.0 - 36.0 g/dL   RDW 12.6 11.5 - 15.5 %   Platelets 241 150 - 400 K/uL   nRBC 0.0 0.0 - 0.2 %  Type and screen [place]     Status: None   Collection Time: 03/16/22  6:43 PM  Result Value Ref Range   ABO/RH(D) A POS    Antibody Screen NEG    Sample Expiration      03/19/2022,2359 Performed at [facility], [address] [place], [place] [address]     Pertinent Results:  Prenatal Labs: Blood type/Rh A POS    Antibody screen Negative    Rubella immune    Varicella Not immune  RPR NR    HBsAg Neg   Hep C NR   HIV NR    GC neg  Chlamydia neg  Genetic screening cfDNA negative   1 hour GTT 111  3 hour GTT   GBS Neg     FHT:  FHR: 135 bpm, variability: moderate,  accelerations:  Present,  decelerations:  Absent Category/reactivity:  Category I UC:   irregular, every 3-5 minutes   Cephalic by Leopolds and SVE   No results found.  Assessment:  Beth Edwards is a 37 y.o. G1P0000 female at 63w2dwith AMA.   Plan:  1. Admit to Labor & Delivery - consents reviewed and obtained - Dr. SOuida Sillsnotified of admission and plan of care   2. Fetal Well being  - Fetal Tracing: category 1 - Group B Streptococcus ppx not indicated: GBS negative - Presentation: cephalic confirmed by SVE   3. Routine OB: - Prenatal labs  reviewed, as above - Rh positive - CBC, T&S, RPR on admit - Clear liquid diet , continuous IV fluids  4. Monitoring of labor  - Contractions monitored with external toco - Pelvis  adequate for trial of labor  - Plan for expectant management  - Augmentation with oxytocin and AROM as appropriate  - Plan for  continuous fetal monitoring - Maternal pain control as desired; planning regional anesthesia, IVPM, low intervention birth options , and unmedicated labor support options  - Anticipate  vaginal delivery  5. Post Partum Planning: - Infant feeding: breast feeding - Contraception: condoms - Tdap vaccine: Given prenatally - Flu vaccine: Given prenatally -RSV vaccine: given prenatally  [place], [place] [id number]  Avelino Leeds, CNM Certified Nurse Midwife [facility]

## 2022-03-17 NOTE — Discharge Summary (Signed)
Obstetrical Discharge Summary  Patient Name: Beth Edwards DOB: [birth year] MRN: [place]:[id number]  Date of Admission: 03/17/2022 Date of Delivery: 03/17/22 Delivered by: Huel Cote MD Date of Discharge: 03/19/22  Primary OB: [place]   LMP:No LMP recorded. EDC Estimated Date of Delivery: 03/14/22 Gestational Age at Delivery: [redacted]w[redacted]d  Antepartum complications: uterine contractions Admitting Diagnosis: primary cesarean section Secondary Diagnosis: Patient Active Problem List   Diagnosis Date Noted   Normal labor and delivery 03/17/2022   Uterine contractions 03/16/2022   COVID-19 affecting pregnancy in third trimester 03/14/2022   High-risk pregnancy, elderly primigravida 08/24/2021   IUD (intrauterine device) in place 09/06/2019   Pelvic pain in female 09/12/2017   Dysmenorrhea 09/12/2017    Augmentation: AROM Complications: fetal intolerance to labor , Meconium thick Intrapartum complications/course: recurrent late decels and bradycardic episodes . Thick meconium  DAte of delivery:03/19/2022  POSTPARTUM hemorrhage  qbl 1150 cc  Delivered By: THuel CoteMD Delivery Type:  emergent LTCS Anesthesia: GETA Placenta: manual Laceration:  Episiotomy: none Newborn Data: Live born female  Birth Weight:  3580 gm APGAR: , 8/9  Newborn Delivery   Birth date/time: 03/17/2022 07:22:00 Delivery type: C-Section, Low Transverse C-section categorization: Primary     Postpartum Procedures: none  Edinburgh:     03/18/2022   12:01 AM  EFlavia ShipperPostnatal Depression Scale Screening Tool  I have been able to laugh and see the funny side of things. 0  I have looked forward with enjoyment to things. 0  I have blamed myself unnecessarily when things went wrong. 1  I have been anxious or worried for no good reason. 1  I have felt scared or panicky for no good reason. 1  Things have been getting on top of me. 0  I have been so unhappy that I have had difficulty  sleeping. 0  I have felt sad or miserable. 0  I have been so unhappy that I have been crying. 0  The thought of harming myself has occurred to me. 0  Edinburgh Postnatal Depression Scale Total 3    Post partum course:   Patient had an uncomplicated postpartum course.  By time of discharge on POD#2, her pain was controlled on oral pain medications; she had appropriate lochia and was ambulating, voiding without difficulty, tolerating regular diet and passing flatus.   She was deemed stable for discharge to home.    Discharge Physical Exam:   BP 117/75 (BP Location: Left Arm)   Pulse 77   Temp 98.6 F (37 C) (Oral)   Resp 18   SpO2 97%   Breastfeeding Unknown   General: NAD CV: RRR Pulm: CTABL, nl effort ABD: s/nd/nt, fundus firm and below the umbilicus Lochia: moderate Incision: c/d/i DVT Evaluation: LE non-ttp, no evidence of DVT on exam.  Hemoglobin  Date Value Ref Range Status  03/19/2022 9.5 (L) 12.0 - 15.0 g/dL Final   HCT  Date Value Ref Range Status  03/19/2022 28.4 (L) 36.0 - 46.0 % Final    Disposition: stable, discharge to home. Baby Feeding: breastmilk Baby Disposition: home with mom  Rh Immune globulin given: n/a, A pos Rubella vaccine given: immune Tdap vaccine given in AP or PP setting: given prenatally Flu vaccine given in AP or PP setting: given prenatally  Contraception: condoms  Prenatal Labs:   Blood type/Rh A POS    Antibody screen Negative    Rubella immune    Varicella Not immune  RPR NR    HBsAg Neg  Hep C NR   HIV NR    GC neg  Chlamydia neg  Genetic screening cfDNA negative   1 hour GTT 111  3 hour GTT    GBS Neg     Plan:  Beth Edwards was discharged to home in good condition. Follow-up appointment with delivering provider in 6 weeks.  Discharge Medications: Allergies as of 03/19/2022       Reactions   Ambien [zolpidem Tartrate] Other (See Comments)   hallucinations   Toradol [ketorolac Tromethamine] Nausea And  Vomiting   Panic attacks         Medication List     TAKE these medications    albuterol 108 (90 Base) MCG/ACT inhaler Commonly known as: VENTOLIN HFA Inhale 2 puffs into the lungs every 6 (six) hours as needed for wheezing or shortness of breath.   ibuprofen 600 MG tablet Commonly known as: ADVIL Take 1 tablet (600 mg total) by mouth every 6 (six) hours as needed for mild pain.   magnesium 30 MG tablet Take 30 mg by mouth 2 (two) times daily.   oxyCODONE 5 MG immediate release tablet Commonly known as: Oxy IR/ROXICODONE Take 1-2 tablets (5-10 mg total) by mouth every 6 (six) hours as needed for severe pain.   prenatal multivitamin Tabs tablet Take 1 tablet by mouth daily at 12 noon.   senna-docusate 8.6-50 MG tablet Commonly known as: Senokot-S Take 2 tablets by mouth at bedtime as needed for mild constipation.         Follow-up Information     Beth Edwards, Gwen Her, MD Follow up in 2 week(s).   Specialty: Obstetrics and Gynecology Why: 2wk incision check Contact information: [address] [place] [address] [phone]         Beth Edwards, Gwen Her, MD Follow up in 6 week(s).   Specialty: Obstetrics and Gynecology Why: 6wk postpartum Contact information: [address] [place] [address] [phone]                 Signed: Milana Huntsman 03/19/2022 9:25 AM

## 2022-03-17 NOTE — Anesthesia Preprocedure Evaluation (Signed)
Anesthesia Evaluation  Patient identified by MRN, date of birth, ID band Patient awake    Reviewed: Allergy & Precautions, H&P , NPO status , Patient's Chart, lab work & pertinent test results  History of Anesthesia Complications (+) PONV and history of anesthetic complications  Airway Mallampati: II  TM Distance: >3 FB Neck ROM: full    Dental no notable dental hx.    Pulmonary asthma    Pulmonary exam normal        Cardiovascular Exercise Tolerance: Good negative cardio ROS Normal cardiovascular exam     Neuro/Psych    GI/Hepatic negative GI ROS,,,  Endo/Other  Hypothyroidism    Renal/GU   negative genitourinary   Musculoskeletal  (+)  Fibromyalgia -  Abdominal   Peds  Hematology negative hematology ROS (+)   Anesthesia Other Findings Past Medical History: No date: Anxiety No date: Asthma No date: Depression No date: Fibromyalgia No date: Headache No date: Hypothyroidism No date: PONV (postoperative nausea and vomiting)     Comment:  after knee surgery was "very ill'  Past Surgical History: 09/12/2017: CHROMOPERTUBATION     Comment:  Procedure: CHROMOPERTUBATION;  Surgeon: Ward, Honor Loh,              MD;  Location: [facility] ORS;  Service: Gynecology;; No date: ENDOMETRIAL BIOPSY No date: KNEE ARTHROSCOPY; Left 09/12/2017: LAPAROSCOPY; N/A     Comment:  Procedure: LAPAROSCOPY OPERATIVE with peritoneal               biopsies;  Surgeon: Ward, Honor Loh, MD;  Location: [facility]               ORS;  Service: Gynecology;  Laterality: N/A; No date: NASAL SINUS SURGERY No date: WISDOM TOOTH EXTRACTION     Reproductive/Obstetrics (+) Pregnancy                             Anesthesia Physical Anesthesia Plan  ASA: 2  Anesthesia Plan: Epidural   Post-op Pain Management:    Induction:   PONV Risk Score and Plan:   Airway Management Planned:   Additional Equipment:    Intra-op Plan:   Post-operative Plan:   Informed Consent: I have reviewed the patients History and Physical, chart, labs and discussed the procedure including the risks, benefits and alternatives for the proposed anesthesia with the patient or authorized representative who has indicated his/her understanding and acceptance.       Plan Discussed with: Anesthesiologist and CRNA  Anesthesia Plan Comments:         Anesthesia Quick Evaluation

## 2022-03-17 NOTE — Anesthesia Procedure Notes (Signed)
Epidural Patient location during procedure: OB Start time: 03/17/2022 4:02 AM End time: 03/17/2022 4:19 AM  Staffing Anesthesiologist: Iran Ouch, MD Performed: anesthesiologist   Preanesthetic Checklist Completed: patient identified, IV checked, site marked, risks and benefits discussed, surgical consent, monitors and equipment checked, pre-op evaluation and timeout performed  Epidural Patient position: sitting Prep: ChloraPrep Patient monitoring: heart rate, continuous pulse ox and blood pressure Approach: midline Location: L3-L4 Injection technique: LOR saline  Needle:  Needle type: Tuohy  Needle gauge: 18 G Needle length: 9 cm Needle insertion depth: 7 cm Catheter type: closed end Catheter size: 20 Guage Catheter at skin depth: 11 cm Test dose: negative and 1.5% lidocaine with Epi 1:200 K  Assessment Events: blood not aspirated, no cerebrospinal fluid, injection not painful and no injection resistance  Additional Notes Reason for block:procedure for pain

## 2022-03-17 NOTE — Brief Op Note (Signed)
03/17/2022  8:11 AM  PATIENT:  Beth Edwards  36 y.o. female  PRE-OPERATIVE DIAGNOSIS: fetal intolerance to labor  Thick meconium, recurrent bradycardia  POST-OPERATIVE DIAGNOSIS: same as above  Viable female   PROCEDURE:  Procedure(s): CESAREAN SECTION Stat LTCS  SURGEON:  Surgeon(s) and Role:    * Sarissa Dern, Gwen Her, MD - Primary  PHYSICIAN ASSISTANT: Felicia dickerson   ASSISTANTS: none   ANESTHESIA:   general  EBL:  qbl : 1150cc   BLOOD ADMINISTERED:none  DRAINS: Urinary Catheter (Foley)   LOCAL MEDICATIONS USED:  MARCAINE     SPECIMEN:  Source of Specimen:  placenta  DISPOSITION OF SPECIMEN:  PATHOLOGY  COUNTS:  YES  TOURNIQUET:  * No tourniquets in log *  DICTATION: .Other Dictation: Dictation Number verbal  PLAN OF CARE: Admit to inpatient   PATIENT DISPOSITION:  PACU - hemodynamically stable.   Delay start of Pharmacological VTE agent (>24hrs) due to surgical blood loss or risk of bleeding: not applicable

## 2022-03-17 NOTE — Lactation Note (Signed)
This note was copied from a baby's chart. Lactation Consultation Note  Patient Name: Beth Edwards [id number] Date: 03/17/2022 Reason for consult: Initial assessment;Primapara;Term;Other (Comment) (c-section) Age:36 hours  Maternal Data Has patient been taught Hand Expression?: Yes Does the patient have breastfeeding experience prior to this delivery?: No  P1, c-section due to NRFHT. Mom dx with hypothyroidism, fibromyalgia, anxiety and depression. Mom desires breastfeeding. States her and dad attended BF education classes through [facility], and have their own Lactation Consultant outside of hospital to follow-up with.  Feeding Mother's Current Feeding Choice: Breast Milk  Baby BF after delivery with support of Transition RN 2x. Parents report BF on way over to [facility], and baby falling asleep post feeding- this is what LC observed upon entry.  LATCH Score Latch: Repeated attempts needed to sustain latch, nipple held in mouth throughout feeding, stimulation needed to elicit sucking reflex.  Audible Swallowing: None  Type of Nipple: Flat  Comfort (Breast/Nipple): Soft / non-tender  Hold (Positioning): Assistance needed to correctly position infant at breast and maintain latch.  LATCH Score: 5   Lactation Tools Discussed/Used    Interventions Interventions: Breast feeding basics reviewed;Hand express;Education (Newborn behaviors and feeding expectations, early cues, feeding on demand, 8-12 attempts, output expectations)  BF basics reviewed. Encouraged frequent attempts. Tips given for achieving optimal latch- reviewed positioning and alignment and deep vs shallow latch. Encouraged hand expression if baby is sleepy to encourage feeding and to stimulate breasts for onset of milk production.  Discharge Pump: Personal  Consult Status Consult Status: Follow-up  Whiteboard updated with [facility] name/contact information. Encouraged to call out as needed for ongoing BF support and  feeding observation.  Beth Edwards 03/17/2022, 12:31 PM

## 2022-03-17 NOTE — Progress Notes (Signed)
Patient admitted at [place] with husband  at which time she was placed on continuous fetal monitoring with category 1 tracing until 0336.Patient then removed monitors due to discomfort until  after epidural was placed.RN educated patient about the importance of continuous fetal monitoring. Upon receiving epidural, the patient then had monitors applied. Baby had moderate variability but started having variables at 0428. Alerted charge nurse and began position changes. Avelino Leeds was notified at [phone] of patient's status. She instructed to put patient in hands and knees position. Patient placed on hands and knees at 0543 with the help of second RN and charge RN Continued to change positions. Contacted Avelino Leeds to evaluate in person at [phone]. CNM at bedside at [phone].

## 2022-03-17 NOTE — Progress Notes (Signed)
Pt returned to day for IOL and promptly developed late decels and recurrent lates decels .  Spoke to pt role for cesarean section . Risks discussed .

## 2022-03-17 NOTE — Progress Notes (Signed)
Labor Check  Subj:  Complaints: }has no unusual complaints  Obj:      Cervix: Dilation: 6 / Effacement (%): 90 / Station: -1  Baseline [id number]: 145 bpm, Variability: Good {> 6 bpm), Accelerations: Reactive, and Decelerations: Variable: late component: prolonged x 7 mins Contractions: regular, every 5-6 minutes Overall assessment: fetal intolerance   Current Vital Signs 24h Vital Sign Ranges  T   Temp  Avg: 98.4 F (36.9 C)  Min: 98.3 F (36.8 C)  Max: 98.4 F (36.9 C)  BP (!) 117/59 BP  Min: 103/52  Max: 134/62  HR 73 Pulse  Avg: 87.4  Min: 73  Max: 98  RR   Resp  Avg: 18  Min: 18  Max: 18  SaO2 97 %   SpO2  Avg: 98.2 %  Min: 97 %  Max: 100 %       Medications SCHEDULED MEDICATIONS   bupivacaine(PF)       chlorhexidine       oxytocin 40 units in LR 1000 mL  333 mL Intravenous Once   sodium citrate-citric acid       terbutaline        MEDICATION INFUSIONS   fentaNYL 2 mcg/mL w/bupivacaine 0.125% in NS 250 mL 12 mL/hr (03/17/22 0414)   lactated ringers     lactated ringers     lactated ringers     oxytocin     oxytocin      PRN MEDICATIONS  acetaminophen, bupivacaine(PF), chlorhexidine, diphenhydrAMINE, ePHEDrine, ePHEDrine, fentaNYL (SUBLIMAZE) injection, fentaNYL 2 mcg/mL w/bupivacaine 0.125% in NS 250 mL, lactated ringers, lidocaine (PF), ondansetron, phenylephrine, phenylephrine, sodium citrate-citric acid, sodium citrate-citric acid, terbutaline    A/P: 37 y.o. G1P0000 female at 1w3dwith None  1.  Labor: Fetal intolerance of labor. pain controlled  Epidural 2.  [id number]: Category II 3.  Group B Strep negative 4. Membranes ruptured, meconium thick 5.  Pain: none 6.  Recheck:Evaluated by digital exam. 7. Intervention: IV fluid bolus, change maternal position, D/C stimulation, administer tocolytic, place IUPC, amnioinfusion, plan Cesarean delivery, and FSE  Dr SOuida Sillsaware FAvelino LeedsCNM [id number] AM

## 2022-03-18 ENCOUNTER — Encounter: Payer: Self-pay | Admitting: Obstetrics and Gynecology

## 2022-03-18 LAB — CBC
HCT: 29.1 % — ABNORMAL LOW (ref 36.0–46.0)
Hemoglobin: 9.9 g/dL — ABNORMAL LOW (ref 12.0–15.0)
MCH: 30.3 pg (ref 26.0–34.0)
MCHC: 34 g/dL (ref 30.0–36.0)
MCV: 89 fL (ref 80.0–100.0)
Platelets: 196 10*3/uL (ref 150–400)
RBC: 3.27 MIL/uL — ABNORMAL LOW (ref 3.87–5.11)
RDW: 12.8 % (ref 11.5–15.5)
WBC: 27.6 10*3/uL — ABNORMAL HIGH (ref 4.0–10.5)
nRBC: 0 % (ref 0.0–0.2)

## 2022-03-18 NOTE — Progress Notes (Signed)
Post Partum Day 1 Subjective: Doing well, no complaints.  Tolerating regular diet, pain with PO meds, voiding and ambulating without difficulty.  No CP SOB Fever,Chills, N/V or leg pain; denies nipple or breast pain no HA change of vision, RUQ/epigastric pain  Objective: BP 120/66 (BP Location: Left Arm)   Pulse 87   Temp 97.8 F (36.6 C) (Oral)   Resp 19   SpO2 95%   Breastfeeding Unknown    Physical Exam:  General: NAD Breasts: soft/nontender CV: RRR Pulm: nl effort, CTABL Abdomen: soft, NT, BS x 4 Incision: Dsg CDI, no drainage  Lochia: small Uterine Fundus: fundus firm and 1 fb below umbilicus DVT Evaluation: no cords, ttp LEs   Recent Labs    03/16/22 1843 03/18/22 0530  HGB 12.8 9.9*  HCT 37.1 29.1*  WBC 14.0* 27.6*  PLT 241 196    Assessment/Plan: 37 y.o. G1P1001 postpartum day # 1  - Continue routine PP care- encouraged shower and ambulation today, plan to change to honeycomb Dsg.  - Lactation consult prn - Acute blood loss anemia - hemodynamically stable and asymptomatic;  po ferrous sulfate BID with stool softeners  - Leukocytosis: afebrile, will repeat CBC in am.  - Immunization status:  needs varicella prior to DC    Disposition: Does not desire Dc home today.     Francetta Found, CNM 03/18/2022  9:55 AM

## 2022-03-18 NOTE — Anesthesia Postprocedure Evaluation (Signed)
Anesthesia Post Note  Patient: Beth Edwards  Procedure(s) Performed: [place]  Patient location during evaluation: Mother Baby Anesthesia Type: Epidural Level of consciousness: awake Pain management: pain level controlled Respiratory status: spontaneous breathing Cardiovascular status: stable Postop Assessment: no headache Anesthetic complications: no   No notable events documented.   Last Vitals:  Vitals:   03/18/22 0001 03/18/22 0336  BP: 130/73 116/64  Pulse: 83 86  Resp: 20 20  Temp: 36.6 C 36.7 C  SpO2: 96% 98%    Last Pain:  Vitals:   03/18/22 0336  TempSrc: Oral  PainSc:                  [name]

## 2022-03-18 NOTE — Lactation Note (Signed)
This note was copied from a baby's chart. Lactation Consultation Note  Patient Name: Beth Edwards [id number] Date: 03/18/2022 Reason for consult: Follow-up assessment;Primapara;Term Age:36 hours  Lactation follow-up.  Maternal Data Has patient been taught Hand Expression?: Yes Does the patient have breastfeeding experience prior to this delivery?: No  Mom feeling better than yesterday; independent with feeding baby. Overall no concerns.  Feeding Mother's Current Feeding Choice: Breast Milk  Baby appears to be feeding well; minimal weight loss. No concerns voiced by parents.  Baby active at the L breast in football hold upon entry. LC notes good position/alignment, rhythmic sucking pattern with occasional swallows identified. Cane Savannah praised mom with independence.  LATCH Score Latch: Grasps breast easily, tongue down, lips flanged, rhythmical sucking. (Baby was latched prior to entry)  Audible Swallowing: A few with stimulation  Type of Nipple: Everted at rest and after stimulation  Comfort (Breast/Nipple): Soft / non-tender  Hold (Positioning): No assistance needed to correctly position infant at breast.  LATCH Score: 9   Lactation Tools Discussed/Used    Interventions Interventions: Breast feeding basics reviewed;Hand express;Breast compression;Education  Reviewed day 2 BF expectations, 8-12 feedings over the next 24 hours. Use of hand expression to help encourage a feeding, 2 wet and 2 stool diapers to help identify adequate transfer.  Reviewed onset of milk production average around day 3, signs of transitional stool, and softening of breast tissue. All questions answered.  Discharge Pump: Personal  Consult Status Consult Status: PRN    Beth Edwards 03/18/2022, 10:11 AM

## 2022-03-19 LAB — CBC
HCT: 28.4 % — ABNORMAL LOW (ref 36.0–46.0)
Hemoglobin: 9.5 g/dL — ABNORMAL LOW (ref 12.0–15.0)
MCH: 30.4 pg (ref 26.0–34.0)
MCHC: 33.5 g/dL (ref 30.0–36.0)
MCV: 90.7 fL (ref 80.0–100.0)
Platelets: 240 10*3/uL (ref 150–400)
RBC: 3.13 MIL/uL — ABNORMAL LOW (ref 3.87–5.11)
RDW: 13.3 % (ref 11.5–15.5)
WBC: 25.5 10*3/uL — ABNORMAL HIGH (ref 4.0–10.5)
nRBC: 0 % (ref 0.0–0.2)

## 2022-03-19 MED ORDER — SENNOSIDES-DOCUSATE SODIUM 8.6-50 MG PO TABS: 2.0000 | ORAL_TABLET | Freq: Every evening | ORAL | 0 refills | Status: DC | PRN

## 2022-03-19 MED ORDER — OXYCODONE HCL 5 MG PO TABS: 5.0000 mg | ORAL_TABLET | Freq: Four times a day (QID) | ORAL | 0 refills | Status: DC | PRN

## 2022-03-19 MED ORDER — IBUPROFEN 600 MG PO TABS: 600.0000 mg | ORAL_TABLET | Freq: Four times a day (QID) | ORAL | 0 refills | Status: DC | PRN

## 2022-03-19 NOTE — Progress Notes (Signed)
D/C order from MD.  Reviewed d/c instructions and prescriptions with patient and answered any questions.  Patient d/c home with infant via wheelchair by nursing/auxillary.

## 2022-03-19 NOTE — Lactation Note (Signed)
This note was copied from a baby's chart. Lactation Consultation Note  Patient Name: Beth Edwards [id number] Date: 03/19/2022 Age:37 years  Reason for consult: Follow-up assessment, primipara, Term, mother's request.   Maternal Data  This is mom's 1st baby, C/S for non-reassuring fetal heart rate. Mom with history of hypothyroidism, fibromyalgia, anxiety , and depression.  On follow-up today baby noted to be using a pacifier initiated earlier this morning. Baby fussy exhibiting feeding cues. Recommended mom attempt breastfeeding.  Feeding  Mother's Current Feeding Choice: Breastmilk  Mom positioned baby well at the left breast. Mom noted to have a small compression stripe across the left nipple. Baby observed arching upper top lip seeking mom's nipple although mom's nipple was in the baby's mouth. Recommended mom modify the hold on her breast to point her nipple upward toward the baby's palate Baby did latch with this technique. Audible swallows noted. Mom burped baby and attempted latching baby at the right breast. Baby again exhibiting the arching of her upper lip and have some latch difficulty. Baby did latch and audible swallows noted. Baby not settled after the 2nd breast. Mom offered the baby back to her left breast in which baby latched and eventually settled appearing content after this feeding.   LATCH Score=8  Latch: Repeated attempts to latch but once latched was rhythmiclly sucking.  Audible Swallowing: Spontaneous and intermittent Type of nipple: everted at rest Comfort(breast/nipples): filling, red/small blisters (left nipple) Hold: Assistance needed to correctly position infant at breast and maintain latch.   Lactation Tools Discussed/Used  Hydrogel dressings, medela nipple shells, coconut oil. Provided and reviewed use for mom's nipple discomfort.  Interventions  Breastfeeding basics, assisted with latch, breast massage,hand expression, breast compression, adjust  position, support pillows, position options, comfort gels, coconut oil, education, breast shells. Discussed with parents feeding plan for home :8-12 feeds in 24 hours following baby's feeding cues, for now avoid use of the pacifier until breastfeeding is established, if baby will not latch and feed mom can hand express and/or pump and provide any expressed breastmilk and /or a supplemental formula feed with the curved tip syringe or slow flow bottle, keep a record of feedings, wet and stool diapers. Reviewed with parents how to know the baby is getting enough including wet and stool diaper counts expected each day. Baby has been having good amounts of wet and stool daipers. Parents understand in this 48-72 hour period to expect at least 3 voids and 2 stools (baby has had 2 voids thus far) Discharge  Engorgement management and breast care, warning signs for feeding baby, outpatient recommendation  Consult Status  PRN until discharge, will be complete this afternoon as mom and baby are discharging to home today.  Update provided to care nurse.   Jonna Desmond Tufano 03/19/2022, 3:31 PM

## 2022-03-20 LAB — SURGICAL PATHOLOGY

## 2022-04-29 NOTE — Progress Notes (Signed)
 Postpartum Examination:    Ms. Beth Edwards is a 37 y.o. female here for postpartum visit. PP care  Condom use planned . She has c/o of fatigue . Not getting enough sleep . H/o hypothyroidism . Not on meds   6 weeks out from c/s   OB/GYN History:  OB History     Gravida  1   Para  1   Term  1   Preterm  0   AB  0   Living  1      SAB  0   IAB  0   Ectopic  0   Molar  0   Multiple  0   Live Births  1         Recent pregnancy  Date of delivery:03/17/22 Breast Feeding: [x]  Yes  []  No Menses: Last Pap: 2021 Rubella Immune: [x]  Yes  []  No  immune VZ Edinburgh Depression Screening Score:  Edinburgh Postnatal Depression Scale    Questions answered by the patient's mother to describe her feelings over the last 7 days:  1. I have been able to laugh and see the funny side of things:    Not quite so much now  2. I have looked forward with enjoyment to things: As much as I ever did  3. I have blamed myself unnecessarily when things went wrong: Yes, some of the time  4. I have been anxious or worried for no good reason: Yes, sometimes  5. I have felt scared or panicky for no very good reason: Yes, sometimes  6. Things have been getting on top of me: Yes, sometimes I haven't been coping as well as usual  7. I have been so unhappy that I have had difficulty sleeping: Not at all  8. I have felt sad or miserable: Not very often  9. I have been so unhappy that I have been crying: Only occasionally  10.The thought of harming myself has occurred to me: Never  Total Score:  11  Interpretation of Edinburgh Total Score:   0 - 8 points: Low probability of depression 8 - 12 points: Most likely just dealing w/ a new baby or the baby blues 13 - 14 points: Signs leading to possibility of PPD; take preventative                          Measures 15 + points: High probability of experiencing clinical depression     Exam:   Vitals:   04/29/22 1128  BP: 108/76   Pulse: 92    Body mass index is 30.7 kg/m.  WDWN white/female in NAD   Lungs: CTA  CV : RRR without murmur   Breast: exam done in sitting and lying position : No dimpling or retraction, no dominant mass, no spontaneous discharge, no axillary adenopathy Neck:  no thyromegaly Abdomen: soft , no mass, normal active bowel sounds,  non-tender, no rebound tenderness Pelvic: tanner stage 5 ,  External genitalia: vulva /labia no lesions Urethra: no prolapse Vagina: normal physiologic d/c Cervix: no lesions, no cervical motion tenderness   Uterus: normal size shape and contour, non-tender Adnexa: no mass,  non-tender   Rectovaginal:  Pelvic exam done to collect PAP Chaperone present  Impression:   The primary encounter diagnosis was Postpartum examination following cesarean delivery ([facility]). Diagnoses of Routine cervical smear and History of hypothyroidism were also pertinent to this visit.    Plan:   Contraception Counseling given. Check TFTs  Pap Pt see therapists for prior depression  Self breast exam taught.  No follow-ups on file.  DEBBY CLARYCE DINSMORE, MD

## 2022-09-20 ENCOUNTER — Encounter: Payer: Self-pay | Admitting: Physician Assistant

## 2022-09-20 ENCOUNTER — Other Ambulatory Visit: Payer: Self-pay | Admitting: Physician Assistant

## 2022-09-20 DIAGNOSIS — R2689 Other abnormalities of gait and mobility: Secondary | ICD-10-CM

## 2022-09-20 DIAGNOSIS — I69398 Other sequelae of cerebral infarction: Secondary | ICD-10-CM

## 2022-09-20 DIAGNOSIS — M545 Low back pain, unspecified: Secondary | ICD-10-CM

## 2022-09-20 DIAGNOSIS — R4189 Other symptoms and signs involving cognitive functions and awareness: Secondary | ICD-10-CM

## 2022-09-20 DIAGNOSIS — R200 Anesthesia of skin: Secondary | ICD-10-CM

## 2022-09-20 DIAGNOSIS — R519 Headache, unspecified: Secondary | ICD-10-CM

## 2022-09-20 DIAGNOSIS — M542 Cervicalgia: Secondary | ICD-10-CM

## 2022-09-20 DIAGNOSIS — R42 Dizziness and giddiness: Secondary | ICD-10-CM

## 2022-09-20 DIAGNOSIS — H539 Unspecified visual disturbance: Secondary | ICD-10-CM

## 2022-10-10 ENCOUNTER — Ambulatory Visit
Admission: RE | Admit: 2022-10-10 | Discharge: 2022-10-10 | Disposition: A | Discharge status: Home / Self Care | Payer: Managed Care, Other (non HMO) | Source: Ambulatory Visit | Attending: Physician Assistant | Admitting: Physician Assistant

## 2022-10-10 DIAGNOSIS — R4189 Other symptoms and signs involving cognitive functions and awareness: Secondary | ICD-10-CM

## 2022-10-10 DIAGNOSIS — R2689 Other abnormalities of gait and mobility: Secondary | ICD-10-CM

## 2022-10-10 DIAGNOSIS — R519 Headache, unspecified: Secondary | ICD-10-CM

## 2022-10-10 DIAGNOSIS — R200 Anesthesia of skin: Secondary | ICD-10-CM

## 2022-10-10 DIAGNOSIS — R42 Dizziness and giddiness: Secondary | ICD-10-CM

## 2022-10-10 DIAGNOSIS — H539 Unspecified visual disturbance: Secondary | ICD-10-CM

## 2022-10-10 MED ORDER — GADOPICLENOL 0.5 MMOL/ML IV SOLN
8.0000 mL | Freq: Once | INTRAVENOUS | Status: AC | PRN
  Administered 2022-10-10: 8 mL via INTRAVENOUS

## 2022-10-14 ENCOUNTER — Other Ambulatory Visit: Payer: Managed Care, Other (non HMO)

## 2022-10-28 ENCOUNTER — Other Ambulatory Visit: Payer: Self-pay | Admitting: Physician Assistant

## 2022-10-28 DIAGNOSIS — R2689 Other abnormalities of gait and mobility: Secondary | ICD-10-CM

## 2022-10-28 DIAGNOSIS — R202 Paresthesia of skin: Secondary | ICD-10-CM

## 2022-10-28 DIAGNOSIS — R200 Anesthesia of skin: Secondary | ICD-10-CM

## 2022-11-05 ENCOUNTER — Ambulatory Visit
Admission: RE | Admit: 2022-11-05 | Discharge: 2022-11-05 | Disposition: A | Discharge status: Home / Self Care | Payer: Managed Care, Other (non HMO) | Source: Ambulatory Visit | Attending: Physician Assistant | Admitting: Physician Assistant

## 2022-11-05 DIAGNOSIS — R202 Paresthesia of skin: Secondary | ICD-10-CM

## 2022-11-05 DIAGNOSIS — R2689 Other abnormalities of gait and mobility: Secondary | ICD-10-CM

## 2023-07-27 ENCOUNTER — Emergency Department
Admission: EM | Admit: 2023-07-27 | Discharge: 2023-07-27 | Disposition: A | Discharge status: Home / Self Care | Attending: Emergency Medicine | Admitting: Emergency Medicine

## 2023-07-27 ENCOUNTER — Other Ambulatory Visit: Payer: Self-pay

## 2023-07-27 DIAGNOSIS — R21 Rash and other nonspecific skin eruption: Secondary | ICD-10-CM | POA: Insufficient documentation

## 2023-07-27 DIAGNOSIS — T360X5A Adverse effect of penicillins, initial encounter: Secondary | ICD-10-CM | POA: Diagnosis not present

## 2023-07-27 DIAGNOSIS — T50905A Adverse effect of unspecified drugs, medicaments and biological substances, initial encounter: Secondary | ICD-10-CM

## 2023-07-27 DIAGNOSIS — J45909 Unspecified asthma, uncomplicated: Secondary | ICD-10-CM | POA: Diagnosis not present

## 2023-07-27 MED ORDER — EPINEPHRINE 0.3 MG/0.3ML IJ SOAJ: 0.3000 mg | Freq: Once | INTRAMUSCULAR | 0 refills | Status: AC

## 2023-07-27 MED ORDER — PREDNISONE 20 MG PO TABS
60.0000 mg | ORAL_TABLET | Freq: Once | ORAL | Status: DC
  Filled 2023-07-27: qty 3

## 2023-07-27 MED ORDER — PREDNISONE 20 MG PO TABS: 40.0000 mg | ORAL_TABLET | Freq: Every day | ORAL | 0 refills | Status: AC

## 2023-07-27 MED ORDER — LORATADINE 10 MG PO TABS
10.0000 mg | ORAL_TABLET | Freq: Once | ORAL | Status: DC
  Filled 2023-07-27: qty 1

## 2023-07-27 NOTE — ED Triage Notes (Signed)
 Pt to ED for concern of possible allergic rx to Lamictal, which she started taking on 6/18. Pt states she thinks is developing SJS because has developed some red marks to hands and arms today that are barely noticeable.  Also was given amoxicillin by PCP 3 days ago, last dose was Friday night (PCP told her to stop--had facial swelling 2 days ago). Pt was placed on amox because of bilateral lymph node swelling to posterior and anterior nodes as well as fatigue and fever (99.8). no sore throat.   Pt stopped taking the Lamictal on Friday night.

## 2023-07-27 NOTE — Discharge Instructions (Signed)
 Please discontinue both amoxicillin and Lamictal as you have done.  Return to our ER right away if you see significant worsening of your rash, skin blistering, lip swelling tongue swelling, difficulty breathing, wheezing, feel faint, rash or blistering appears around your eyes, inside your mouth, or vagina.    Call your primary doctor tomorrow for a close follow-up.

## 2023-07-27 NOTE — ED Provider Notes (Addendum)
 [facility] Provider Note    Event Date/Time   First MD Initiated Contact with Patient 07/27/23 1201     (approximate)   History   Rash   HPI  Beth Edwards is a 38 y.o. female today started to notice that she was having a rash on her arms and slight on her feet.  It is, slightly itchy.  Yesterday she had swelling of her lips as well as slight wheezing and thought it might have been that she had a's slight asthma exacerbation, though symptoms have gone away.  She has not had any lesions inside her mouth or vagina.  None of the rashes have blistered open.  She reports she had felt like she had some swollen lymph nodes but those have improved in her neck as well.  She has been taking Lamictal since June 18 and amoxicillin for about 4 days.  She stopped both on Friday.  Her sore throat and swollen lymph nodes have improved.  She feels much better.  She is concerned no that given she takes Lamictal if this rash could represent Stevens-Johnson's based on her reading.  She has also talked with both her family medicine and psychiatry doctors who counseled her to seek evaluation in the ER today as they could not see her  No lightheadedness.  No pain.  The rash is not painful.  It is slightly raised and bumpy.   Physical Exam   Triage Vital Signs: ED Triage Vitals  Encounter Vitals Group     BP 07/27/23 1147 (!) 140/80     Girls Systolic BP Percentile --      Girls Diastolic BP Percentile --      Boys Systolic BP Percentile --      Boys Diastolic BP Percentile --      Pulse Rate 07/27/23 1145 (!) 101     Resp 07/27/23 1145 20     Temp 07/27/23 1145 98 F (36.7 C)     Temp Source 07/27/23 1145 Oral     SpO2 07/27/23 1145 100 %     Weight 07/27/23 1146 171 lb (77.6 kg)     Height 07/27/23 1146 5' 7 (1.702 m)     Head Circumference --      Peak Flow --      Pain Score 07/27/23 1144 0     Pain Loc --      Pain Education --      Exclude from Growth Chart  --     Most recent vital signs: Vitals:   07/27/23 1147 07/27/23 1224  BP: (!) 140/80   Pulse:    Resp:    Temp:    SpO2:  100%     General: Awake, no distress.   See media uploads of rash.  Most prominent over both arms.  Of important note there are no lesions inside the intraoral cavity or lips.  No lesions around her eyes.  She denies any vaginal lesions.  The rash is maculopapular, it is very much palpable and is not large flat or macular.  There is no sloughing of skin.  The rash does not slough with palpation.  The rash does not appear streptococcal in nature and does not have a significant broad sandpapery appearance  CV:  Good peripheral perfusion.  Resp:  Normal effort.  Clear bilateral with normal work of breathing Abd:  No distention.  Other:     ED Results / Procedures / Treatments  Labs (all labs ordered are listed, but only abnormal results are displayed) Labs Reviewed - No data to display   EKG     RADIOLOGY     PROCEDURES:  Critical Care performed: No  Procedures   MEDICATIONS ORDERED IN ED: Medications  loratadine  (CLARITIN ) tablet 10 mg (10 mg Oral Not Given 07/27/23 1226)  predniSONE  (DELTASONE ) tablet 60 mg (60 mg Oral Not Given 07/27/23 1226)     IMPRESSION / MDM / ASSESSMENT AND PLAN / ED COURSE  I reviewed the triage vital signs and the nursing notes.                              Differential diagnosis includes, but is not limited to, likely allergic reaction or viral rash.  She does not have any clinical exam findings that are suggestive of Stevens-Johnson syndrome at this time.  She is very concerned about that, reasonably so, but at this time her exam is very reassuring and shows no evidence of SJS by my assessment.  I do think she is having an allergic reaction possibly a delayed reaction to use of amoxicillin or could still be correlated with Lamictal though she has now stopped both agents.  I counseled her carefully.  She  will start prednisone  and over-the-counter Claritin .  We discussed very careful return precautions including specifically reviewing that she will return to the ER right away if she gets any blistering or lesions inside the mouth, around the eyes the vagina or if the rash worsens or she develops other more advancing or serious symptoms.  Patient's presentation is most consistent with acute, uncomplicated illness.   Return precautions and treatment recommendations and follow-up discussed with the patient who is agreeable with the plan.        FINAL CLINICAL IMPRESSION(S) / ED DIAGNOSES   Final diagnoses:  Maculopapular rash, generalized  Adverse effect of drug, initial encounter     Rx / DC Orders   ED Discharge Orders          Ordered    predniSONE  (DELTASONE ) 20 MG tablet  Daily        07/27/23 1236    EPINEPHrine  0.3 mg/0.3 mL IJ SOAJ injection   Once        07/27/23 1236             Note:  This document was prepared using Dragon voice recognition software and may include unintentional dictation errors.   Dicky Anes, MD 07/27/23 1243    ----------------------------------------- 12:54 PM on 07/27/2023 ----------------------------------------- Our on-call dermatologist, Dr. Dela returned my voicemail (had called earlier and it went straight to voicemail and left a return call request).  We discussed case, timing of and type of symptoms, use of lamictal and amox (both now discontinued). He advised based on assessment and with no mucous membrane involvement or skin sloughing/blistering it would be highly unlikely to represent Stevens-Johnson or TEN.  He took the patient's information and phone number and will have his clinic reach out to her for a close follow-up appointment and assessment of rash .    Dicky Anes, MD 07/27/23 1256

## 2023-07-29 ENCOUNTER — Telehealth: Payer: Self-pay | Admitting: Student in an Organized Health Care Education/Training Program

## 2023-07-29 ENCOUNTER — Encounter: Payer: Self-pay | Admitting: Allergy & Immunology

## 2023-07-29 ENCOUNTER — Other Ambulatory Visit: Payer: Self-pay

## 2023-07-29 ENCOUNTER — Ambulatory Visit: Admitting: Allergy & Immunology

## 2023-07-29 VITALS — BP 110/70 | HR 89 | Temp 98.0°F | Resp 16 | Ht 67.0 in | Wt 174.8 lb

## 2023-07-29 DIAGNOSIS — F53 Postpartum depression: Secondary | ICD-10-CM

## 2023-07-29 DIAGNOSIS — Z888 Allergy status to other drugs, medicaments and biological substances status: Secondary | ICD-10-CM

## 2023-07-29 DIAGNOSIS — T50905D Adverse effect of unspecified drugs, medicaments and biological substances, subsequent encounter: Secondary | ICD-10-CM

## 2023-07-29 NOTE — Progress Notes (Signed)
 NEW PATIENT  Date of Service/Encounter:  07/29/23  Consult requested by: Leni Lavetta Hight, MD   Assessment:   Adverse effect of drug - amoxicillin versus lamotrigine  Postpartum depression  Plan/Recommendations:   1. Adverse effect of drug - lamotrigine and/or penicillin - We are going to get a genetic test to look for a propensity to develop Stevens-Johnson reaction. (HLA-B*15:02).  - Follow up with the results of the CMP. - We will get an ANA and inflammatory markers.  - In the meantime, complete the prednisone . - We need to do penicillin testing at some point.  - Hold the lamotrigine for now.  2. Return in about 4 weeks (around 08/26/2023) for PENICILLIN TESTING. You can have the follow up appointment with Dr. Iva or a Nurse Practicioner (our Nurse Practitioners are excellent and always have Physician oversight!).     This note in its entirety was forwarded to the Provider who requested this consultation.  Subjective:   Beth Edwards is a 38 y.o. female presenting today for evaluation of  Chief Complaint  Patient presents with   Establish Care   Allergic Reaction   Angioedema   Rash   Pain   Breathing Problem    Asthma issues during the allergic reaction    Beth Edwards has a history of the following: Patient Active Problem List   Diagnosis Date Noted   Normal labor and delivery 03/17/2022   Uterine contractions 03/16/2022   COVID-19 affecting pregnancy in third trimester 03/14/2022   High-risk pregnancy, elderly primigravida 08/24/2021   IUD (intrauterine device) in place 09/06/2019   Pelvic pain in female 09/12/2017   Dysmenorrhea 09/12/2017    History obtained from: chart review and patient and her husband.  Discussed the use of AI scribe software for clinical note transcription with the patient and/or guardian, who gave verbal consent to proceed.  Beth Edwards was referred by Leni Lavetta Hight, MD.     Beth Edwards is a 38  y.o. female presenting for an evaluation of a possible drug allergy.  It looks like  she was seen in the emergency room on July 6 for evaluation of a widespread rash.  She also had swelling of her lips and slight wheezing. She was not having any lesions in her mouth.  She did feel some swollen lymph nodes as well. She stopped her Lamictal and the amoxicillin that she was on for four days. She was given a course of prednisone  in the ED.   She recently increased her lamotrigine dosage from 25 mg to 50 mg for mood stabilization, which was effective for her postpartum depression. Approximately a week ago, she developed rapidly worsening lymphadenopathy. Her primary care provider suspected a bacterial infection and prescribed amoxicillin.  After starting amoxicillin, she experienced lip swelling by Friday morning, which worsened by Saturday. Despite discontinuing amoxicillin, her lymphadenopathy persisted, prompting her to contact her psychiatrist, who advised stopping lamotrigine due to concerns about a severe reaction.  By Saturday evening, she developed a rash and irritation on her arms, which worsened by Sunday morning, leading her to visit the emergency room. The rash is described as painful, with blister-like fluid-filled lesions, but not pruritic. She experienced fever, night sweats, and fatigue, with a recorded temperature of 99.57F while on Tylenol .  She has been on prednisone  and has two more days left of the course. Blood work was done by her OB GYN, showing low eosinophil percentage, but liver and kidney function tests are still pending. She  has a new amoxicillin allergy noted in her chart, although she doubts its validity as she has taken it previously without issues.  She reports that her primary care provider has refused to provide follow-up care, and she is seeking alternative care options. She has a history of active Epstein-Barr virus infection during college but no diagnosed autoimmune  conditions. She is currently without a primary care provider after being dropped by her previous one.  Wed - PM - amoxicillin  Fri - light swelling  Sun - still worse. Stopped amoxicillin.   CMP - pending EBV   RegisCAR Scoring Tool for DRESS:   Total score of 1, indicating no likelihood of DRESS.      Otherwise, there is no history of other atopic diseases, including drug allergies, stinging insect allergies, or contact dermatitis. There is no significant infectious history. Vaccinations are up to date.    Past Medical History: Patient Active Problem List   Diagnosis Date Noted   Normal labor and delivery 03/17/2022   Uterine contractions 03/16/2022   COVID-19 affecting pregnancy in third trimester 03/14/2022   High-risk pregnancy, elderly primigravida 08/24/2021   IUD (intrauterine device) in place 09/06/2019   Pelvic pain in female 09/12/2017   Dysmenorrhea 09/12/2017    Medication List:  Allergies as of 07/29/2023       Reactions   Lamotrigine Shortness Of Breath, Swelling, Rash   Ambien [zolpidem Tartrate] Other (See Comments)   hallucinations   Toradol  [ketorolac  Tromethamine ] Nausea And Vomiting   Panic attacks    Amoxicillin Rash   Patient reports rash, and intends to follow with an allergist to see if actually has a PCN allergy.         Medication List        Accurate as of July 29, 2023  6:27 PM. If you have any questions, ask your nurse or doctor.          albuterol 108 (90 Base) MCG/ACT inhaler Commonly known as: VENTOLIN HFA Inhale 2 puffs into the lungs every 6 (six) hours as needed for wheezing or shortness of breath.   amoxicillin-clavulanate 875-125 MG tablet Commonly known as: AUGMENTIN Take 875 mg by mouth.   Botox 200 units injection Generic drug: botulinum toxin Type A   EPINEPHrine  0.3 mg/0.3 mL Soaj injection Commonly known as: EPI-PEN SMARTSIG:Milliliter(s) IM   magnesium 30 MG tablet Take 30 mg by mouth 2 (two) times  daily.   magnesium oxide 400 MG tablet Commonly known as: MAG-OX Take 400 mg by mouth.   predniSONE  20 MG tablet Commonly known as: DELTASONE  Take 2 tablets (40 mg total) by mouth daily for 5 days.   prenatal multivitamin Tabs tablet Take 1 tablet by mouth daily at 12 noon.   senna-docusate 8.6-50 MG tablet Commonly known as: Senokot-S Take 2 tablets by mouth at bedtime as needed for mild constipation.   Ubrogepant 100 MG Tabs Take 100 mg by mouth.        Birth History: non-contributory  Developmental History: non-contributory  Past Surgical History: Past Surgical History:  Procedure Laterality Date   CESAREAN SECTION  03/17/2022   Procedure: CESAREAN SECTION;  Surgeon: Schermerhorn, Debby PARAS, MD;  Location: [facility] ORS;  Service: Obstetrics;;   CHROMOPERTUBATION  09/12/2017   Procedure: CHROMOPERTUBATION;  Surgeon: Ward, Mitzie BROCKS, MD;  Location: [facility] ORS;  Service: Gynecology;;   ENDOMETRIAL BIOPSY     KNEE ARTHROSCOPY Left    LAPAROSCOPY N/A 09/12/2017   Procedure: LAPAROSCOPY OPERATIVE with peritoneal biopsies;  Surgeon: Ward, Mitzie BROCKS, MD;  Location: [facility] ORS;  Service: Gynecology;  Laterality: N/A;   NASAL SINUS SURGERY     WISDOM TOOTH EXTRACTION       Family History: Family History  Problem Relation Age of Onset   Diabetes Maternal Grandfather    Breast cancer Paternal Grandmother      Social History: Jamilia lives at home with her family.  She lives in a house that was built in 1979.  There is hardwood in the main living areas and laminate in the bedroom.  They have a heat pump for heating and central cooling.  There are 2 dogs inside of the house.  There are no dust mite covers on the bedding.  There is no tobacco exposure.  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  She is not exposed to fumes, chemicals, or dust.  She does not have a HEPA filter in her home.  She does not live near an interstate or industrial  area.   Review of systems otherwise negative other than that mentioned in the HPI.    Objective:   Last menstrual period 07/26/2023, not currently breastfeeding. There is no height or weight on file to calculate BMI.     Physical Exam Vitals reviewed.  Constitutional:      Appearance: She is well-developed.  HENT:     Head: Normocephalic and atraumatic.     Right Ear: Tympanic membrane, ear canal and external ear normal. No drainage, swelling or tenderness. Tympanic membrane is not injected, scarred, erythematous, retracted or bulging.     Left Ear: Tympanic membrane, ear canal and external ear normal. No drainage, swelling or tenderness. Tympanic membrane is not injected, scarred, erythematous, retracted or bulging.     Nose: No nasal deformity, septal deviation, mucosal edema or rhinorrhea.     Right Sinus: No maxillary sinus tenderness or frontal sinus tenderness.     Left Sinus: No maxillary sinus tenderness or frontal sinus tenderness.     Mouth/Throat:     Mouth: Mucous membranes are not pale and not dry.     Pharynx: Uvula midline.  Eyes:     General:        Right eye: No discharge.        Left eye: No discharge.     Conjunctiva/sclera: Conjunctivae normal.     Right eye: Right conjunctiva is not injected. No chemosis.    Left eye: Left conjunctiva is not injected. No chemosis.    Pupils: Pupils are equal, round, and reactive to light.  Cardiovascular:     Rate and Rhythm: Normal rate and regular rhythm.     Heart sounds: Normal heart sounds.  Pulmonary:     Effort: Pulmonary effort is normal. No tachypnea, accessory muscle usage or respiratory distress.     Breath sounds: Normal breath sounds. No wheezing, rhonchi or rales.  Chest:     Chest wall: No tenderness.  Abdominal:     Tenderness: There is no abdominal tenderness. There is no guarding or rebound.  Lymphadenopathy:     Head:     Right side of head: No submandibular, tonsillar or occipital adenopathy.      Left side of head: No submandibular, tonsillar or occipital adenopathy.     Cervical: No cervical adenopathy.  Skin:    Coloration: Skin is not pale.     Findings: No abrasion, erythema, petechiae or rash. Rash is not papular, urticarial or vesicular.  Neurological:     Mental Status: She is alert.  Psychiatric:        Behavior: Behavior is cooperative.      Diagnostic studies: labs sent instead    Marty Shaggy, MD [facility] 

## 2023-07-29 NOTE — Patient Instructions (Addendum)
 1. Adverse effect of drug - lamotrigine and/or penicillin - We are going to get a genetic test to look for a propensity to develop Stevens-Johnson reaction. (HLA-B*15:02).  - Follow up with the results of the CMP. - We will get an ANA and inflammatory markers.  - In the meantime, complete the prednisone . - We need to do penicillin testing at some point.  - Hold the lamotrigine for now.  2. Return in about 4 weeks (around 08/26/2023) for PENICILLIN TESTING. You can have the follow up appointment with Dr. Iva or a Nurse Practicioner (our Nurse Practitioners are excellent and always have Physician oversight!).    Please inform us  of any Emergency Department visits, hospitalizations, or changes in symptoms. Call us  before going to the ED for breathing or allergy symptoms since we might be able to fit you in for a sick visit. Feel free to contact us  anytime with any questions, problems, or concerns.  It was a pleasure to see you again today!  Websites that have reliable patient information: 1. American Academy of Asthma, Allergy, and Immunology: www.aaaai.org 2. Food Allergy Research and Education (FARE): foodallergy.org 3. Mothers of Asthmatics: http://www.asthmacommunitynetwork.org 4. American College of Allergy, Asthma, and Immunology: www.acaai.org      "Like" us  on Facebook and Instagram for our latest updates!      A healthy democracy works best when [employer] participate! Make sure you are registered to vote! If you have moved or changed any of your contact information, you will need to get this updated before voting! Scan the QR codes below to learn more!

## 2023-07-29 NOTE — Telephone Encounter (Signed)
 Left message for Beth Edwards that she will need to follow up with her current PCP Beth Edwards for HFU but is welcome to make a new patient appt when we have available in August.    Copied from CRM [id number]. Topic: Appointments - Appointment Scheduling >> Jul 29, 2023  9:23 AM Beth Edwards wrote: Patient/patient representative is calling to schedule an appointment. Refer to attachments for appointment information.  Beth Edwards is a new patient and needs a emergency room follow up. There is no appointment available within the 14 day period. Please give her a call to set up appointment at [phone]. Thanks

## 2023-07-30 LAB — SEDIMENTATION RATE: Sed Rate: 4 mm/h (ref 0–32)

## 2023-07-30 LAB — ANTINUCLEAR ANTIBODIES, IFA: ANA Titer 1: NEGATIVE

## 2023-07-30 LAB — C-REACTIVE PROTEIN: CRP: <1 mg/L (ref 0–10)

## 2023-07-31 ENCOUNTER — Ambulatory Visit: Payer: Self-pay | Admitting: Allergy & Immunology

## 2023-08-04 ENCOUNTER — Other Ambulatory Visit: Payer: Self-pay

## 2023-08-04 ENCOUNTER — Emergency Department: Admission: EM | Admit: 2023-08-04 | Discharge: 2023-08-04 | Disposition: A | Discharge status: Home / Self Care

## 2023-08-04 DIAGNOSIS — R200 Anesthesia of skin: Secondary | ICD-10-CM | POA: Diagnosis not present

## 2023-08-04 DIAGNOSIS — J45909 Unspecified asthma, uncomplicated: Secondary | ICD-10-CM | POA: Insufficient documentation

## 2023-08-04 DIAGNOSIS — R21 Rash and other nonspecific skin eruption: Secondary | ICD-10-CM | POA: Diagnosis not present

## 2023-08-04 DIAGNOSIS — R519 Headache, unspecified: Secondary | ICD-10-CM | POA: Insufficient documentation

## 2023-08-04 DIAGNOSIS — R42 Dizziness and giddiness: Secondary | ICD-10-CM | POA: Diagnosis present

## 2023-08-04 DIAGNOSIS — R4182 Altered mental status, unspecified: Secondary | ICD-10-CM | POA: Diagnosis not present

## 2023-08-04 LAB — CBC WITH DIFFERENTIAL/PLATELET
Abs Immature Granulocytes: 0.04 K/uL (ref 0.00–0.07)
Basophils Absolute: 0.1 K/uL (ref 0.0–0.1)
Basophils Relative: 1 %
Eosinophils Absolute: 0.1 K/uL (ref 0.0–0.5)
Eosinophils Relative: 2 %
HCT: 44.1 % (ref 36.0–46.0)
Hemoglobin: 15 g/dL (ref 12.0–15.0)
Immature Granulocytes: 1 %
Lymphocytes Relative: 33 %
Lymphs Abs: 2.7 K/uL (ref 0.7–4.0)
MCH: 29.1 pg (ref 26.0–34.0)
MCHC: 34 g/dL (ref 30.0–36.0)
MCV: 85.6 fL (ref 80.0–100.0)
Monocytes Absolute: 0.4 K/uL (ref 0.1–1.0)
Monocytes Relative: 5 %
Neutro Abs: 4.9 K/uL (ref 1.7–7.7)
Neutrophils Relative %: 58 %
Platelets: 313 K/uL (ref 150–400)
RBC: 5.15 MIL/uL — ABNORMAL HIGH (ref 3.87–5.11)
RDW: 12 % (ref 11.5–15.5)
WBC: 8.2 K/uL (ref 4.0–10.5)
nRBC: 0 % (ref 0.0–0.2)

## 2023-08-04 LAB — HCG, QUANTITATIVE, PREGNANCY: hCG, Beta Chain, Quant, S: <1 m[IU]/mL (ref <5)

## 2023-08-04 LAB — COMPREHENSIVE METABOLIC PANEL WITH GFR
ALT: 25 U/L (ref 0–44)
AST: 19 U/L (ref 15–41)
Albumin: 4.3 g/dL (ref 3.5–5.0)
Alkaline Phosphatase: 50 U/L (ref 38–126)
Anion gap: 6 (ref 5–15)
BUN: 12 mg/dL (ref 6–20)
CO2: 27 mmol/L (ref 22–32)
Calcium: 9.6 mg/dL (ref 8.9–10.3)
Chloride: 107 mmol/L (ref 98–111)
Creatinine, Ser: 0.73 mg/dL (ref 0.44–1.00)
GFR, Estimated: >60 mL/min (ref >60)
Glucose, Bld: 106 mg/dL — ABNORMAL HIGH (ref 70–99)
Potassium: 3.6 mmol/L (ref 3.5–5.1)
Sodium: 140 mmol/L (ref 135–145)
Total Bilirubin: 0.7 mg/dL (ref 0.0–1.2)
Total Protein: 7.5 g/dL (ref 6.5–8.1)

## 2023-08-04 LAB — HLA-B*15:02

## 2023-08-04 LAB — TROPONIN I (HIGH SENSITIVITY): Troponin I (High Sensitivity): 3 ng/L (ref <18)

## 2023-08-04 MED ORDER — PREDNISONE 20 MG PO TABS: ORAL_TABLET | ORAL | 0 refills | Status: AC

## 2023-08-04 MED ORDER — SODIUM CHLORIDE 0.9 % IV BOLUS
1000.0000 mL | Freq: Once | INTRAVENOUS | Status: AC
  Administered 2023-08-04: 1000 mL via INTRAVENOUS

## 2023-08-04 MED ORDER — PROCHLORPERAZINE MALEATE 10 MG PO TABS
10.0000 mg | ORAL_TABLET | Freq: Four times a day (QID) | ORAL | 0 refills | Status: AC | PRN
Start: 2023-08-04 — End: ?

## 2023-08-04 MED ORDER — DIPHENHYDRAMINE HCL 50 MG/ML IJ SOLN
25.0000 mg | Freq: Once | INTRAMUSCULAR | Status: AC
  Administered 2023-08-04: 25 mg via INTRAVENOUS
  Filled 2023-08-04: qty 1

## 2023-08-04 MED ORDER — METOCLOPRAMIDE HCL 5 MG/ML IJ SOLN
10.0000 mg | Freq: Once | INTRAMUSCULAR | Status: AC
  Administered 2023-08-04: 10 mg via INTRAVENOUS
  Filled 2023-08-04: qty 2

## 2023-08-04 MED ORDER — ACETAMINOPHEN 500 MG PO TABS
1000.0000 mg | ORAL_TABLET | Freq: Once | ORAL | Status: AC
  Administered 2023-08-04: 1000 mg via ORAL
  Filled 2023-08-04: qty 2

## 2023-08-04 NOTE — ED Provider Notes (Signed)
 [facility] Provider Note    Event Date/Time   First MD Initiated Contact with Patient 08/04/23 [phone]     (approximate)   History   Altered Mental Status  Arrives POV with Spouse with ongoing dizziness with a room spinning feeling, altered mental status, brain fog, bilateral arm numbness from hands proximal to elbows, bilateral leg numbness from feet proximal to knees and around mouth. Dull headache that she says feels different from her typical migraines.    Continues to say numbness has been recurrent and manifested Saturday but has gotten worse.   Recently dx with DRESS syndrome and said she has been having a rash r/t Lamictal.    HPI Beth Edwards is a 38 y.o. female PMH eczema, fibromyalgia, anxiety, depression, asthma presents for evaluation of multiple complaints - Patient has been having paresthesias and dizziness throughout the day today as well as a moderate headache that is global, not acute onset, less severe than her typical migraine which is usually unilateral. - Brought in by her husband because he felt that she was less interactive than usual, may have been slightly confused 2 months slower to respond than usual but notes she is acting more like herself currently.  Did have paresthesias in her bilateral upper and lower extremities.  No focal weakness.  Also notes some lightheadedness. - Patient also notes that she has been having some intermittent recurrence of her prior rash which was felt to likely be associated with either lamotrigine or penicillin, see summary of recent care below.  In addition to this, notes she was also seen by a dermatologist who felt she may have had DRESS syndrome.  Patient does express some concerns about possible complications of this diagnosis.   Per chart review, patient was seen in clinic on 07/29/2023 by allergist.  Felt to have had adverse effect of lamotrigine and/or penicillin.  Plan for return in about 4 weeks  for penicillin testing.  Holding lamotrigine.  I previously be seen in the emergency department on 07/27/2023.  Diffuse rash and swelling of her lips with slight wheezing.  No mouth lesions.  Had been on amoxicillin for 4 days and had also recently had her lamotrigine dosing increased.  Started on prednisone .     Physical Exam   Triage Vital Signs: ED Triage Vitals  Encounter Vitals Group     BP 08/04/23 1910 136/86     Girls Systolic BP Percentile --      Girls Diastolic BP Percentile --      Boys Systolic BP Percentile --      Boys Diastolic BP Percentile --      Pulse Rate 08/04/23 1910 91     Resp 08/04/23 1910 20     Temp 08/04/23 1910 98.4 F (36.9 C)     Temp src --      SpO2 08/04/23 1910 98 %     Weight 08/04/23 1911 171 lb (77.6 kg)     Height 08/04/23 1911 5' 7 (1.702 m)     Head Circumference --      Peak Flow --      Pain Score 08/04/23 1911 6     Pain Loc --      Pain Education --      Exclude from Growth Chart --     Most recent vital signs: Vitals:   08/04/23 2300 08/04/23 2330  BP: 135/89 125/75  Pulse: 86 92  Resp: 18 19  Temp:  SpO2: 100% 100%     General: Awake, no distress.  CV:  Good peripheral perfusion. RRR, RP 2+ Resp:  Normal effort. CTAB Abd:  No distention. Nontender to deep palpation throughout Neuro:  Aox4, CN II-XII intact, FNF wnl, finger taps fast b/l, 5/5 strength in bilateral finger extension/grip, arm flexion/extension, EHL/FHL. BUE AG 10+ sec no drift, BLE AG 5+ sec no drift. Ambulates with steady gait. SILT. Negative Rhomberg. Skin:  Mild scattered erythematous rash to bilateral wrists   ED Results / Procedures / Treatments   Labs (all labs ordered are listed, but only abnormal results are displayed) Labs Reviewed  CBC WITH DIFFERENTIAL/PLATELET - Abnormal; Notable for the following components:      Result Value   RBC 5.15 (*)    All other components within normal limits  COMPREHENSIVE METABOLIC PANEL WITH GFR -  Abnormal; Notable for the following components:   Glucose, Bld 106 (*)    All other components within normal limits  HCG, QUANTITATIVE, PREGNANCY  TROPONIN I (HIGH SENSITIVITY)  TROPONIN I (HIGH SENSITIVITY)     EKG  Ecg = sinus rhythm, rate 82, no gross ST elevation or depression, no significant repolarization abnormality, normal axis, normal intervals.  No evidence of ischemia no arrhythmia on my interpretation.   RADIOLOGY N/a    PROCEDURES:  Critical Care performed: No  Procedures   MEDICATIONS ORDERED IN ED: Medications  sodium chloride  0.9 % bolus 1,000 mL (0 mLs Intravenous Stopped 08/04/23 2106)  metoCLOPramide  (REGLAN ) injection 10 mg (10 mg Intravenous Given 08/04/23 2054)  diphenhydrAMINE  (BENADRYL ) injection 25 mg (25 mg Intravenous Given 08/04/23 2100)  acetaminophen  (TYLENOL ) tablet 1,000 mg (1,000 mg Oral Given 08/04/23 2053)     IMPRESSION / MDM / ASSESSMENT AND PLAN / ED COURSE  I reviewed the triage vital signs and the nursing notes.                              DDX/MDM/AP: Differential diagnosis includes, but is not limited to, possible atypical migraine (though she does frequently get headaches with neurologic symptoms though localization is different today), doubt intracranial mass or hemorrhage given no red flags on history and nonfocal neurologic exam, do not clinically suspect meningitis/encephalitis at this time.  Patient's mentation does appear very normal to me as well and prior symptoms are not particularly suggestive of TIA.  Did have normal brain MRI in November 2024, no underlying structural abnormalities.  No urinary symptoms to suggest UTI.  Highly doubt anginal equivalent, arrhythmia.  Plan: - Labs - EKG - Tylenol , Reglan , Benadryl , IV fluids - In shared decision making, patient is comfortable deferring head CT which I believe is very reasonable. - Reevaluate  Patient's presentation is most consistent with acute presentation with  potential threat to life or bodily function.  The patient is on the cardiac monitor to evaluate for evidence of arrhythmia and/or significant heart rate changes.  ED course below.  Workup unremarkable, patient still with some persistent symptoms despite treatment but declines further treatment at this time, would like to trial Compazine  at home and follow-up with her neurologist, primary care provider, dermatologist and allergist for ongoing evaluation and management.  Patient does have some recurrent mild rash on her bilateral forearms, did ask about possibility of starting steroids which I believe is not unreasonable, started on 6-day taper of prednisone .  No evidence of acute pathology today, stable for discharge home.  ED return precautions in place.  Patient agrees with plan.  Clinical Course as of 08/04/23 2351  Mon Aug 04, 2023  2031 CBC reassuring, no leukocytosis or left shift, normal hemoglobin [MM]  2109 CMP reviewed, no significant electrolyte abnormality  hCG negative  Troponin normal [MM]  2304 Patient reevaluated, does note that she has some persistent headache but that is not severe.  Offered additional trial of Compazine  though patient declined, notes headache is only a 6/10 which is much better than her typical migraine.  Overall unclear etiology of presentation today though no evidence of acute pathology at this time.  Did offer head imaging again though no headache red flags and neurologic exam is reassuring, patient prefers to defer which I believe is very reasonable.  Will follow-up with her neurologist as outpatient as well as her allergist and dermatologist.  Has been having some mild recurrence of her recent rash, primarily on bilateral upper extremities today.  She did feel much better on steroids and felt this helped minimize rash, asking if we can restart her--I will put her on a short taper over the next 6 days.  Plan for outpatient follow-up.  ED return precautions in  place.  Patient agrees with plan. [MM]    Clinical Course User Index [MM] Clarine Ozell LABOR, MD     FINAL CLINICAL IMPRESSION(S) / ED DIAGNOSES   Final diagnoses:  Nonintractable headache, unspecified chronicity pattern, unspecified headache type  Rash  Dizziness     Rx / DC Orders   ED Discharge Orders          Ordered    prochlorperazine  (COMPAZINE ) 10 MG tablet  Every 6 hours PRN        08/04/23 2316    predniSONE  (DELTASONE ) 20 MG tablet  Q breakfast        08/04/23 2318             Note:  This document was prepared using Dragon voice recognition software and may include unintentional dictation errors.   Clarine Ozell LABOR, MD 08/04/23 2351

## 2023-08-04 NOTE — Discharge Instructions (Signed)
 Your evaluation in the emergency department is overall reassuring.  Demonstrates the exact cause of your symptoms today, but we saw no concerning findings, and your lab work showed no significant abnormalities.  With regard to your rash on your arms, I have started you on a 6-day taper of steroids.  Please do follow-up with your primary care provider, neurologist, dermatologist, and an allergist for ongoing workup and management.  Return to the emergency department with any new or worsening symptoms.

## 2023-08-04 NOTE — ED Triage Notes (Signed)
 Arrives POV with Spouse with ongoing dizziness with a room spinning feeling, altered mental status, brain fog, bilateral arm numbness from hands proximal to elbows, bilateral leg numbness from feet proximal to knees and around mouth. Dull headache that she says feels different from her typical migraines.    Continues to say numbness has been recurrent and manifested Saturday but has gotten worse.   Recently dx with DRESS syndrome and said she has been having a rash r/t Lamictal.

## 2023-08-11 ENCOUNTER — Encounter: Payer: Self-pay | Admitting: Allergy & Immunology

## 2023-09-09 ENCOUNTER — Ambulatory Visit: Admitting: Allergy & Immunology

## 2023-09-09 ENCOUNTER — Encounter: Payer: Self-pay | Admitting: Allergy & Immunology

## 2023-09-09 ENCOUNTER — Other Ambulatory Visit: Payer: Self-pay

## 2023-09-09 VITALS — BP 110/64 | HR 90 | Temp 98.3°F | Resp 12 | Ht 67.0 in | Wt 174.7 lb

## 2023-09-09 DIAGNOSIS — T50905D Adverse effect of unspecified drugs, medicaments and biological substances, subsequent encounter: Secondary | ICD-10-CM

## 2023-09-09 DIAGNOSIS — Z880 Allergy status to penicillin: Secondary | ICD-10-CM | POA: Diagnosis not present

## 2023-09-09 NOTE — Progress Notes (Signed)
 FOLLOW UP  Date of Service/Encounter:  09/09/23   Assessment:   Adverse effect of drug - lamotrigine   Postpartum depression    Plan/Recommendations:   1. Adverse effect of drug - lamotrigine - You tolerated your penicillin challenge today. - We are going to remove this from your allergy list. - I am not convinced that this reaction was DRESS, but I understand the concern about re-introducing this again given the symptoms and time course.  - I do feel bad that there are no other options for you treatment wise.  - I am not an expert on EBV, but I am not convinced that an Infectious Disease doctor is going to do much with you. - I would recommend Dr. Almarie Cleveland ([facility]), Dr. Lynwood Rush ([facility]), Dr. Worth [facility]), Dr. Barnie Louder ([facility]), Dr. Rolland Helena ([facility]), Dr. Garnette Lukes ([facility]), or Dr. Worth Kitty [facility]).   2. Return if symptoms worsen or fail to improve. You can have the follow up appointment with Dr. Iva or a Nurse Practicioner (our Nurse Practitioners are excellent and always have Physician oversight!).    Subjective:   Beth Edwards is a 38 y.o. female presenting today for follow up of  Chief Complaint  Patient presents with   Food/Drug Challenge    Penicillin    Beth Edwards has a history of the following: Patient Active Problem List   Diagnosis Date Noted   Normal labor and delivery 03/17/2022   Uterine contractions 03/16/2022   COVID-19 affecting pregnancy in third trimester 03/14/2022   High-risk pregnancy, elderly primigravida 08/24/2021   IUD (intrauterine device) in place 09/06/2019   Pelvic pain in female 09/12/2017   Dysmenorrhea 09/12/2017    History obtained from: chart review and patient.  Discussed the use of AI scribe software for clinical note transcription with the patient and/or guardian, who gave verbal consent to proceed.  Beth Edwards is a 38 y.o. female presenting for a drug challenge.  She was last  seen in July 2025 as a new patient.  She was concerned with a likely drug reaction to the lamotrigine versus a penicillin.  We obtain genetic testing to look for a genetic defect associated with Stevens-Johnson syndrome (HLA-B*15:02).  This was negative.  We did obtain an ANA and inflammatory markers which were normal.  She was concerned with dress syndrome, but her metabolic panel looks great.  We did RegisCAR testing which scored 1, indicating unlikely DRESS syndrome.   After the visit, she said some previous labs that showed a normal CBC, normal metabolic panel, and elevated EBV IgG.  She experienced a rash that flared intermittently for about two weeks but has not recurred since the first week of August. The rash was described as 'an echo of the previous' and did not involve mucosal surfaces, although there was swelling around the lips. She has a history of a reaction to lamotrigine.  She has a history of treatment-resistant depression, with three hospitalizations. She is unable to afford further invasive treatments like ketamine  therapy. She is currently not on any medication for depression and relies on coping mechanisms. She has a long-term psychiatrist and a talk therapist.  She has chronic fatigue and pain, previously diagnosed as fibromyalgia, though she feels it does not fully match her symptoms. She has experienced cognitive issues, which led to the discovery of a severe vitamin D deficiency, now managed with a high-dose supplement.  She has a past EBV infection, with positive IgG levels, but no  active infection. She is uncertain about the significance of the IgG levels and the need for further specialist consultation.  She is seeking a new primary care physician for regular check-ups and management of her chronic conditions, as her previous provider is no longer available to her. She also requires a pediatrician for her daughter, Beth Edwards, who is a year and a half old.  Otherwise, there have  been no changes to her past medical history, surgical history, family history, or social history.    Review of systems otherwise negative other than that mentioned in the HPI.    Objective:   Blood pressure 110/64, pulse 90, temperature 98.3 F (36.8 C), resp. rate 12, height 5' 7 (1.702 m), weight 174 lb 11.2 oz (79.2 kg), SpO2 97%, not currently breastfeeding. Body mass index is 27.36 kg/m.    Physical exam deferred since this was a challenge appointment only.    Diagnostic studies:    Open graded amoxicillin oral challenge: The patient was able to tolerate the challenge today without adverse signs or symptoms. Vital signs were stable throughout the challenge and observation period. She received multiple doses separated by 30 minutes, each of which was separated by vitals and a brief physical exam. She received the following doses: 100mg  (1 mL) and 700mg  (9 mL) . She was monitored for 60 minutes following the last dose.   Allergy Studies: none       Marty Shaggy, MD  [facility] 

## 2023-09-09 NOTE — Addendum Note (Signed)
 Addended by: NANCEE JON SAILOR on: 09/09/2023 06:33 PM   Modules accepted: Orders

## 2023-09-09 NOTE — Patient Instructions (Addendum)
 1. Adverse effect of drug - lamotrigine - You tolerated your penicillin challenge today. - We are going to remove this from your allergy list. - I am not convinced that this reaction was DRESS, but I understand the concern about re-introducing this again given the symptoms and time course.  - I do feel bad that there are no other options for you treatment wise.  - I am not an expert on EBV, but I am not convinced that an Infectious Disease doctor is going to do much with you. - I would recommend Dr. Almarie Cleveland ([facility]), Dr. Lynwood Rush ([facility]), Dr. Worth Moloney [facility]), Dr. Barnie Louder ([facility]), Dr. Rolland Helena ([facility]), Dr. Garnette Lukes ([facility]), or Dr. Worth Kitty [facility]).   2. Return if symptoms worsen or fail to improve. You can have the follow up appointment with Dr. Iva or a Nurse Practicioner (our Nurse Practitioners are excellent and always have Physician oversight!).    Please inform us  of any Emergency Department visits, hospitalizations, or changes in symptoms. Call us  before going to the ED for breathing or allergy symptoms since we might be able to fit you in for a sick visit. Feel free to contact us  anytime with any questions, problems, or concerns.  It was a pleasure to see you again today!  Websites that have reliable patient information: 1. American Academy of Asthma, Allergy, and Immunology: www.aaaai.org 2. Food Allergy Research and Education (FARE): foodallergy.org 3. Mothers of Asthmatics: http://www.asthmacommunitynetwork.org 4. American College of Allergy, Asthma, and Immunology: www.acaai.org      "Like" us  on Facebook and Instagram for our latest updates!      A healthy democracy works best when [employer] participate! Make sure you are registered to vote! If you have moved or changed any of your contact information, you will need to get this updated before voting! Scan the QR codes below to learn more!

## 2023-12-29 ENCOUNTER — Other Ambulatory Visit: Payer: Self-pay | Admitting: Obstetrics and Gynecology

## 2023-12-29 DIAGNOSIS — N6489 Other specified disorders of breast: Secondary | ICD-10-CM

## 2024-01-01 ENCOUNTER — Other Ambulatory Visit: Payer: Self-pay | Admitting: Obstetrics and Gynecology

## 2024-01-01 ENCOUNTER — Ambulatory Visit
Admission: RE | Admit: 2024-01-01 | Discharge: 2024-01-01 | Disposition: A | Discharge status: Home / Self Care | Source: Ambulatory Visit | Attending: Obstetrics and Gynecology | Admitting: Obstetrics and Gynecology

## 2024-01-01 DIAGNOSIS — N6489 Other specified disorders of breast: Secondary | ICD-10-CM | POA: Diagnosis present

## 2024-01-01 DIAGNOSIS — R928 Other abnormal and inconclusive findings on diagnostic imaging of breast: Secondary | ICD-10-CM

## 2024-01-06 ENCOUNTER — Encounter

## 2024-01-06 ENCOUNTER — Other Ambulatory Visit

## 2024-01-09 ENCOUNTER — Ambulatory Visit
Admission: RE | Admit: 2024-01-09 | Discharge: 2024-01-09 | Disposition: A | Discharge status: Home / Self Care | Source: Ambulatory Visit | Attending: Obstetrics and Gynecology | Admitting: Obstetrics and Gynecology

## 2024-01-09 DIAGNOSIS — R928 Other abnormal and inconclusive findings on diagnostic imaging of breast: Secondary | ICD-10-CM | POA: Insufficient documentation

## 2024-01-30 ENCOUNTER — Encounter

## 2024-01-30 ENCOUNTER — Other Ambulatory Visit

## 2024-05-14 ENCOUNTER — Ambulatory Visit: Payer: Self-pay | Admitting: Student
